# Patient Record
Sex: Female | Born: 1960
Health system: Southern US, Community
[De-identification: ages and names within clinical notes are randomized; demographics above are authoritative.]

## PROBLEM LIST (undated history)

## (undated) DIAGNOSIS — Z86718 Personal history of other venous thrombosis and embolism: Secondary | ICD-10-CM

## (undated) DIAGNOSIS — B009 Herpesviral infection, unspecified: Secondary | ICD-10-CM

## (undated) DIAGNOSIS — E079 Disorder of thyroid, unspecified: Secondary | ICD-10-CM

## (undated) DIAGNOSIS — M1990 Unspecified osteoarthritis, unspecified site: Secondary | ICD-10-CM

## (undated) DIAGNOSIS — F32A Depression, unspecified: Secondary | ICD-10-CM

## (undated) DIAGNOSIS — Z8489 Family history of other specified conditions: Secondary | ICD-10-CM

## (undated) DIAGNOSIS — I10 Essential (primary) hypertension: Secondary | ICD-10-CM

## (undated) DIAGNOSIS — F329 Major depressive disorder, single episode, unspecified: Secondary | ICD-10-CM

## (undated) DIAGNOSIS — T7840XA Allergy, unspecified, initial encounter: Secondary | ICD-10-CM

## (undated) DIAGNOSIS — R519 Headache, unspecified: Secondary | ICD-10-CM

## (undated) HISTORY — PX: WISDOM TOOTH EXTRACTION: SHX21

## (undated) HISTORY — PX: ABDOMINAL HYSTERECTOMY: SHX81

## (undated) HISTORY — DX: Major depressive disorder, single episode, unspecified: F32.9

## (undated) HISTORY — DX: Disorder of thyroid, unspecified: E07.9

## (undated) HISTORY — DX: Allergy, unspecified, initial encounter: T78.40XA

## (undated) HISTORY — DX: Depression, unspecified: F32.A

## (undated) HISTORY — PX: RETINAL DETACHMENT SURGERY: SHX105

## (undated) HISTORY — PX: THYROIDECTOMY: SHX17

## (undated) HISTORY — DX: Personal history of other venous thrombosis and embolism: Z86.718

## (undated) HISTORY — DX: Herpesviral infection, unspecified: B00.9

---

## 1998-08-06 DIAGNOSIS — Z86718 Personal history of other venous thrombosis and embolism: Secondary | ICD-10-CM

## 1998-08-06 HISTORY — DX: Personal history of other venous thrombosis and embolism: Z86.718

## 1998-11-16 ENCOUNTER — Inpatient Hospital Stay (HOSPITAL_COMMUNITY): Admission: AD | Admit: 1998-11-16 | Discharge: 1998-11-17 | Payer: Self-pay | Admitting: Emergency Medicine

## 1998-12-07 ENCOUNTER — Ambulatory Visit (HOSPITAL_COMMUNITY): Admission: RE | Admit: 1998-12-07 | Discharge: 1998-12-07 | Payer: Self-pay | Admitting: Emergency Medicine

## 2000-01-10 ENCOUNTER — Encounter: Admission: RE | Admit: 2000-01-10 | Discharge: 2000-01-10 | Payer: Self-pay | Admitting: Emergency Medicine

## 2000-01-10 ENCOUNTER — Encounter: Payer: Self-pay | Admitting: Emergency Medicine

## 2000-01-25 ENCOUNTER — Other Ambulatory Visit: Admission: RE | Admit: 2000-01-25 | Discharge: 2000-01-25 | Payer: Self-pay | Admitting: Obstetrics and Gynecology

## 2000-02-02 ENCOUNTER — Ambulatory Visit (HOSPITAL_COMMUNITY): Admission: RE | Admit: 2000-02-02 | Discharge: 2000-02-02 | Payer: Self-pay | Admitting: Obstetrics and Gynecology

## 2000-02-02 ENCOUNTER — Encounter: Payer: Self-pay | Admitting: Obstetrics and Gynecology

## 2000-03-29 ENCOUNTER — Inpatient Hospital Stay (HOSPITAL_COMMUNITY): Admission: AD | Admit: 2000-03-29 | Discharge: 2000-04-01 | Payer: Self-pay | Admitting: Obstetrics and Gynecology

## 2000-03-29 ENCOUNTER — Encounter (INDEPENDENT_AMBULATORY_CARE_PROVIDER_SITE_OTHER): Payer: Self-pay

## 2002-12-07 ENCOUNTER — Encounter (INDEPENDENT_AMBULATORY_CARE_PROVIDER_SITE_OTHER): Payer: Self-pay | Admitting: Specialist

## 2002-12-07 ENCOUNTER — Observation Stay (HOSPITAL_COMMUNITY): Admission: RE | Admit: 2002-12-07 | Discharge: 2002-12-08 | Payer: Self-pay | Admitting: Surgery

## 2002-12-07 ENCOUNTER — Encounter: Payer: Self-pay | Admitting: Surgery

## 2003-07-15 ENCOUNTER — Encounter: Admission: RE | Admit: 2003-07-15 | Discharge: 2003-07-15 | Payer: Self-pay | Admitting: Emergency Medicine

## 2004-07-05 ENCOUNTER — Encounter: Admission: RE | Admit: 2004-07-05 | Discharge: 2004-07-05 | Payer: Self-pay | Admitting: Emergency Medicine

## 2004-09-22 ENCOUNTER — Ambulatory Visit (HOSPITAL_COMMUNITY): Admission: RE | Admit: 2004-09-22 | Discharge: 2004-09-23 | Payer: Self-pay | Admitting: Ophthalmology

## 2005-08-03 ENCOUNTER — Encounter: Admission: RE | Admit: 2005-08-03 | Discharge: 2005-08-03 | Payer: Self-pay | Admitting: Emergency Medicine

## 2006-06-05 ENCOUNTER — Encounter (INDEPENDENT_AMBULATORY_CARE_PROVIDER_SITE_OTHER): Payer: Self-pay | Admitting: Specialist

## 2006-06-05 ENCOUNTER — Ambulatory Visit (HOSPITAL_COMMUNITY): Admission: RE | Admit: 2006-06-05 | Discharge: 2006-06-06 | Payer: Self-pay | Admitting: Obstetrics and Gynecology

## 2006-06-12 ENCOUNTER — Observation Stay (HOSPITAL_COMMUNITY): Admission: AD | Admit: 2006-06-12 | Discharge: 2006-06-13 | Payer: Self-pay | Admitting: Obstetrics and Gynecology

## 2008-01-15 ENCOUNTER — Encounter: Admission: RE | Admit: 2008-01-15 | Discharge: 2008-01-15 | Payer: Self-pay | Admitting: Emergency Medicine

## 2010-11-08 ENCOUNTER — Emergency Department (HOSPITAL_COMMUNITY)

## 2010-11-08 ENCOUNTER — Emergency Department (HOSPITAL_COMMUNITY)
Admission: EM | Admit: 2010-11-08 | Discharge: 2010-11-08 | Disposition: A | Discharge status: Home / Self Care | Attending: Emergency Medicine | Admitting: Emergency Medicine

## 2010-11-08 DIAGNOSIS — W010XXA Fall on same level from slipping, tripping and stumbling without subsequent striking against object, initial encounter: Secondary | ICD-10-CM | POA: Insufficient documentation

## 2010-11-08 DIAGNOSIS — M542 Cervicalgia: Secondary | ICD-10-CM | POA: Insufficient documentation

## 2010-11-08 DIAGNOSIS — Y990 Civilian activity done for income or pay: Secondary | ICD-10-CM | POA: Insufficient documentation

## 2010-11-08 DIAGNOSIS — R55 Syncope and collapse: Secondary | ICD-10-CM | POA: Insufficient documentation

## 2010-11-08 DIAGNOSIS — E039 Hypothyroidism, unspecified: Secondary | ICD-10-CM | POA: Insufficient documentation

## 2010-11-08 DIAGNOSIS — I10 Essential (primary) hypertension: Secondary | ICD-10-CM | POA: Insufficient documentation

## 2010-11-08 DIAGNOSIS — Y9269 Other specified industrial and construction area as the place of occurrence of the external cause: Secondary | ICD-10-CM | POA: Insufficient documentation

## 2010-11-08 DIAGNOSIS — R42 Dizziness and giddiness: Secondary | ICD-10-CM | POA: Insufficient documentation

## 2010-11-08 DIAGNOSIS — R209 Unspecified disturbances of skin sensation: Secondary | ICD-10-CM | POA: Insufficient documentation

## 2010-11-08 DIAGNOSIS — R61 Generalized hyperhidrosis: Secondary | ICD-10-CM | POA: Insufficient documentation

## 2010-11-08 DIAGNOSIS — IMO0002 Reserved for concepts with insufficient information to code with codable children: Secondary | ICD-10-CM | POA: Insufficient documentation

## 2010-11-08 DIAGNOSIS — S025XXA Fracture of tooth (traumatic), initial encounter for closed fracture: Secondary | ICD-10-CM | POA: Insufficient documentation

## 2010-12-22 NOTE — Discharge Summary (Signed)
[facility]  Patient:    Courtney [name], Courtney [name]                  MRN: [id number] Adm. Date:  [id number] Disc. Date: [id number] Attending:  Leonard Schwartz Dictator:   Miguel Dibble, C.N.M.                           Discharge Summary  DATE OF BIRTH:                Jul 07, 1961  ADMISSION DIAGNOSES:          1. Intrauterine pregnancy at term.                               2. Breech position.                               3. Fibroids.                               4. Plans for permanent sterilization.                               5. Infant will be placed for adoption.  DISCHARGE DIAGNOSES:          1. Intrauterine pregnancy at term.                               2. Breech position.                               3. Fibroids.                               4. Plans for permanent sterilization.                               5. Infant will be placed for adoption.                               6. Delivered viable baby boy weighing 7 pounds                                  5 ounces, Apgars of 9 and 9, footling                                  breech, 2 cm fibroid on posterior left                                  uterus.  PROCEDURES:                   1. Primary lower segment transverse cesarean  section.                               2. Bilateral tubal ligation.  ANESTHESIA:                   General.  HOSPITAL COURSE:              On March 29, 2000, 50 year old Rhys Anchondo was admitted for primary cesarean section and bilateral tubal ligation.  Her surgery was uncomplicated.  She delivered a viable baby boy. The infant weighed 7 pounds 5 ounces, Apgars of 9 and 9, by footling breech. Her cords and tubes were ligated, and she proceeded on to recovery in stable condition.  On postoperative day #1, March 30, 2000, her highest temperature was 100.6.  She was otherwise feeling satisfactory.  She saw her baby and  the adoptive mother the previous night and was sad but coping well.  She has made plans for the infant to be placed for adoption.  She had a small amount of old sanguineus drainage on her dressing but, otherwise, her incision was clean, dry, and intact.  Her hemoglobin had dropped from 13.1 to 11.7, hematocrit 34.1, white count 12, and platelets 149.  Vital signs remained stable for the remainder of the day.  On postoperative day #2, on March 31, 2000, she was visited by the Child psychotherapist.  The plan was for the patient to be discharged with her baby and then to have the adoption take place following the home study for the adoptive mother, Ms. Delores Williams.  The attorney in [place], [place], is working on this case.  The mother is otherwise feeling comfortable with this adoption plan.  On postoperative day #2, her vital signs were stable.  Incision clean, dry, and intact.  She was holding and caring for the infant, along with the adoptive mother.  On postoperative day #3, on April 01, 2000, she was still tearful about the adoption but resigned to her decision.  Her vital signs were stable.  Incision clean, dry, and intact.  Fundus firm below the umbilicus.  Scant lochia.  Trace edema in the extremities.  She was coping well.  After deeming to have received the full benefit of her hospitalization, she was discharged home after discontinuation of staples and application of Steri-Strips.  DISCHARGE INSTRUCTIONS:       Per [employer].  DISCHARGE MEDICATIONS:        Motrin, Tylox, prenatal vitamins. DD:  04/01/00 TD:  04/02/00 Job: [id number]

## 2010-12-22 NOTE — Op Note (Signed)
[facility]  Patient:    Courtney [name], Courtney [name]               MRN: [id number] Proc. Date: 03/29/00 Adm. Date:  [id number] Attending:  Leonard Schwartz                           Operative Report  PREOPERATIVE DIAGNOSES:       1. Term intrauterine pregnancy.                               2. Transverse presentation.                               3. Desires sterilization.                               4. Infant for adoption.  POSTOPERATIVE DIAGNOSES:      1. Term intrauterine pregnancy.                               2. Footling breech presentation.                               3. Desires sterilization.                               4. Fibroid uterus.                               5. Infant for adoption.  OPERATIONS:                   1. Primary low-transverse cesarean section.                               2. Bilateral tubal ligation.  SURGEON:                      Janine Limbo, M.D.  ASSISTANT:                    Miguel Dibble, C.N.M.  ANESTHESIA:                   General.  FINDINGS:                     A 7 pound 5 ounce female infant was delivered from a double-footling breech presentation.  The Apgars were 9 at one minute and 9 at five minutes. There was a 2 cm fibroid present on the left posterior uterus.  There was a 1 cm flat fibroid present on the left anterior uterus. The fallopian tubes and ovaries were normal.  DISPOSITION:                  The patient is a 50 year old female, gravida 2, para 0, 0, 1, 0, who presents at [redacted] weeks gestation.  She understands that an option for care is to attempt an external version, but she declines this option.  She wants  to proceed with cesarean delivery.  She also desires permanent sterilization.  This infant is for adoption.  The patient understands the indications for her procedure and she accepts the risks of, but not limited to anesthetic complications, bleeding, infection,  possible damage to surrounding organs, and possible tubal failure (10/1,000).  DESCRIPTION OF PROCEDURE:     The patient was brought to the operating room where the abdomen and perineum were prepped with multiple layers of Betadine. A Foley catheter was placed in the bladder.  The patient was sterilely draped. The patient was then given a general anesthetic.  A low-transverse incision was made in the abdomen and carried sharply through the subcutaneous tissue, the fascia and the anterior peritoneum.  An incision was made in the lower uterine segment and extended transversely.  The infant was delivered from a footling breech presentation without difficult.  The mouth and nose were suctioned.  The infant was handed to the awaiting pediatric team.  Routine cord blood studies were obtained.  The placenta was removed.  The uterine cavity was cleaned of amniotic fluid and clotted blood.  The cervix was dilated.  The incision was closed using a running locking suture of 2-0 Vicryl.  Hemostasis was adequate.  The left fallopian tube was identified and followed to its fimbriated end.  A knuckle of tube was made on the left using a tied suture ligature and then a free-tie of 0-plain catgut.  The knuckle of tube thus made was excised.  The cut ends of the tube were cauterized.  Hemostasis was adequate.  An identical procedure was carried out on the opposite side.  Again, hemostasis was adequate.  The peritoneal cavity was then irrigated.  The anterior peritoneum and the abdominal musculature were reapproximated in the midline.  The fascia was then closed using a running suture of 0-Vicryl followed by three interrupted sutures of 0-Vicryl.  The subcutaneous area was closed using a running suture of 2-0 Vicryl.  The skin was reapproximated using skin staples.  Sponge, needle and instrument counts were correct on two occasions.  The estimated blood loss for this procedure was 600 cc.  The  patient tolerated her procedure well.  She was taken to the recovery room in stable condition.  The infant was taken to the full-term nursery in stable condition. DD:  03/29/00 TD:  03/31/00 Job: [id number]

## 2010-12-22 NOTE — Op Note (Signed)
Courtney [name], Courtney [name]                     ACCOUNT NO.:  [id number]   MEDICAL RECORD NO.:  [id number]                   PATIENT TYPE:  AMB   LOCATION:  DAY                                  FACILITY:  [facility]   PHYSICIAN:  Velora Heckler, M.D.                DATE OF BIRTH:  [birth year]   DATE OF PROCEDURE:  12/07/2002  DATE OF DISCHARGE:                                 OPERATIVE REPORT   PREOPERATIVE DIAGNOSIS:  Thyroid goiter with compressive symptoms.   POSTOPERATIVE DIAGNOSIS:  Thyroid goiter with compressive symptoms.   PROCEDURE:  Total thyroidectomy.   SURGEON:  Velora Heckler, M.D.   ASSISTANT:  Gita Kudo, M.D.   ANESTHESIA:  General.   ESTIMATED BLOOD LOSS:  Minimal.   PREPARATION:  Betadine.   COMPLICATIONS:  None.   INDICATIONS:  The patient is a 50 year old black female, who presented in  January 2004, with large thyroid goiter.  This had gradually increased in  size over 3-4 years.  The patient noted tightening of her uniforms at work.  The patient developed mild compressive symptoms by March 2004.  She  developed a chronic cough.  She has developed intermittent hoarseness.  She  now comes to surgery for resection.   DESCRIPTION OF PROCEDURE:  The procedure is done in OR #11 at the [facility].  The patient is brought to the operating room and  placed in a supine position on the operating room table.  Following the  administration of general anesthesia, the patient is prepped and draped in  the usual strict aseptic fashion.  After ascertaining that an adequate level  of anesthesia had been obtained, a Kocher incision is made with a #15 blade.  Dissection is carried down through the subcutaneous tissues and platysma.  Hemostasis is obtained with the electrocautery.  Skin flaps are developed  cephalad and caudad from the thyroid notch to the sternal notch.  A Mahorner  self-retaining retractor is placed for exposure.  Strap muscles  are incised  in the midline.  Dissection is begun on the left side.  Strap muscles are  reflected laterally.  Left is markedly enlarged.  It appears to be a large  homogeneous goiter.  Venous tributaries are divided between medium  Ligaclips.  Strap muscles are reflected laterally.  Gland is rolled  medially.  Adventitial tissue is swept off using a Barista.  Superior pole is dissected out.  The superior pole vessels are ligated in  continuity with 2-0 silk ligatures and medium Ligaclips and divided.  Gland  is rolled further anteriorly.  Parathyroid tissue is identified and  preserved.  Recurrent laryngeal nerve is identified and preserved.  Branches  of the inferior thyroid artery are divided between small Ligaclips.  Venous  tributaries are divided between small and medium Ligaclips.  Gland is rolled  further up and onto the anterior surface of the trachea, where it  is  mobilized withe electrocautery.  A pack is placed in the left neck.   Next, we turned our attention to the right lobe.  Again, strap muscles are  reflected laterally.  Right lobe is much smaller, approximately 1-2 times  normal size.  There are no significant nodular densities.  There is no  lymphadenopathy.  Right lobe is mobilized.  Venous tributaries are again  divided between small Ligaclips.  Superior pole goes quite high in the neck.  It is dissected out, ligated in continuity with 2-0 silk ties and medium  Ligaclips, and divided.  Gland is rolled further anteriorly.  Both  parathyroid glands are identified and preserved.  onto the anterior trachea  and mobilized across the isthmus.  Good hemostasis is noted.  A dry pack is  placed in the right neck.   Next, turned our attention to the left lobe.  Again, strap muscles are  reflected laterally.  Left lobe is markedly smaller, although it does  contain at least two nodules on palpation.  Middle thyroid vein is divided  between small Ligaclips.  Inferior  venous tributaries are divided between  small Ligaclips.  Gland is rolled anteriorly.  Superior pole is mobilized  and ligated in continuity with 2-0 silk ties and medium Ligaclips and  divided.  Gland is rolled further anteriorly.  Recurrent laryngeal nerve is  identified and preserved.  Ligament of Allyson Sabal is transected, and the gland is  rolled up and onto the anterior surface of the trachea from which it is  resected with the electrocautery.  Gland is marked with a suture in the left  superior pole and submitted in toto to pathology for review.  Good  hemostasis is obtained in the neck after irrigation.  Hemostasis was  obtained with small Ligaclips and the electrocautery.  Surgicel is placed  over the area of the recurrent laryngeal nerves bilaterally.  Strap muscles  are reapproximated in the midline with interrupted 3-0 Vicryl sutures.  Platysma is closed with interrupted 3-0 Vicryl sutures.  Skin edges are  reapproximated with widely-spaced stainless steel staples and interspaced  half-inch Steri-Strips and Benzoin.  Sterile dressings are applied.  The  patient is awakened from anesthesia and brought to the recovery room in  stable condition.  The patient tolerated the procedure well.                                               Velora Heckler, M.D.    TMG/MEDQ  D:  12/07/2002  T:  12/07/2002  Job:  [id number]   cc:   Gabriel Earing, M.D.  [address]  [place]  [place] [address]  Fax: [phone]

## 2010-12-22 NOTE — Op Note (Signed)
Courtney [name], Courtney [name]           ACCOUNT NO.:  [id number]   MEDICAL RECORD NO.:  [id number]          PATIENT TYPE:  AMB   LOCATION:  SDC                           FACILITY:  [facility]   PHYSICIAN:  Janine Limbo, M.D.DATE OF BIRTH:  October 17, 1960   DATE OF PROCEDURE:  06/05/2006  DATE OF DISCHARGE:                                 OPERATIVE REPORT   PREOPERATIVE DIAGNOSIS:  1. Menorrhagia  2. Fibroid uterus.  3. Dysmenorrhea.  4. History of deep venous thrombosis.   POSTOPERATIVE DIAGNOSIS:  1. Menorrhagia  2. Fibroid uterus.  3. Dysmenorrhea.  4. History of deep venous thrombosis.   PROCEDURE:  1. Vaginal hysterectomy.  2. Bilateral salpingectomies.   SURGEON:  Janine Limbo, M.D.   FIRST ASSISTANT:  Hal Morales, M.D.   ANESTHETIC:  General.   DISPOSITION:  S. Decuir is a 50 year old female, para 1-0-0-1 who presents  with the above-mentioned diagnosis.  She understands the indications for her  surgical procedure; and she accepts the risks of, but not limited to,  anesthetic complications, bleeding, infections, and possible damage to the  surrounding organs.   FINDINGS:  The uterus was approximately 141 grams; and it contained multiple  fibroids.  It was approximately 10 weeks size.  The fallopian tubes and  ovaries appeared normal.  No other pathology was noted.   DESCRIPTION OF PROCEDURE:  The patient was taken to the operating room where  a general anesthetic was given.  The patient's abdomen, perineum, and vagina  were prepped with multiple layers of Betadine.  A Foley catheter was placed  in the bladder.  Examination under anesthesia was performed.  The patient  was sterilely draped.  The cervix was injected with 30 mL of a diluted  solution of Pitressin and saline.   A circumferential incision was made around the cervix; and the vaginal  mucosa was advanced anteriorly and posteriorly.  The anterior cul-de-sac and  the posterior cul-de-sac was  sharply entered.  Alternating from right-to-  left the uterosacral ligaments, paracervical tissues, parametrial tissues,  and uterine arteries were clamped, cut, sutured, and then tied securely.  The upper pedicles were clamped and cut; and the uterus was removed from the  operative field.  The upper pedicles were secured using first a free tie and  then suture ligatures.  Hemostasis was achieved using figure-of-eight  sutures on the posterior cuff.   Hemostasis was felt to be adequate.  The fallopian tubes were easily  accessible and, therefore, the fallopian tubes were clamped and removed.  Suture ligatures were used for hemostasis.  The sutures attached to the  uterosacral ligaments were brought out through the vaginal angles and tied  securely.  A McCall culdoplasty suture was placed in the posterior cul-de-  sac incorporating the uterosacral ligaments bilaterally, and the posterior  peritoneum.  A final check was made for hemostasis; and, again, hemostasis  was confirmed.  The vaginal cuff was then closed using figure-of-eight  sutures incorporating the anterior vaginal mucosa, the anterior peritoneum,  the posterior peritoneum, and the posterior vaginal mucosa.   Again, hemostasis was adequate.  The [employer]  culdoplasty suture was tied  securely and the apex of vagina was noted to elevate into the mid pelvis.  Sponge, needle, and instrument counts were correct on two occasions.  The  estimated blood loss was 125 mL; #0 Vicryl was the suture material used  throughout the case.  The patient was awakened from her anesthetic; and she  was taken to the recovery room in stable condition.  The uterus and the  fallopian tubes were sent to pathology for evaluation.  The patient was  noted to drain 250 mL of clear yellow urine.  The patient received 1600 mL  of IV fluid.      Janine Limbo, M.D.  Electronically Signed     AVS/MEDQ  D:  06/05/2006  T:  06/05/2006  Job:  [id number]    cc:   Reuben Likes, M.D.  Fax: [phone]

## 2010-12-22 NOTE — Op Note (Signed)
NAMEMAKYRA, CORPREW           ACCOUNT NO.:  [id number]   MEDICAL RECORD NO.:  [id number]          PATIENT TYPE:  OIB   LOCATION:  2899                         FACILITY:  [facility]   PHYSICIAN:  Alford Highland. Rankin, M.D.   DATE OF BIRTH:  April 12, 1961   DATE OF PROCEDURE:  09/22/2004  DATE OF DISCHARGE:                                 OPERATIVE REPORT   PREOPERATIVE DIAGNOSIS:  Rhegmatogenous retinal detachment of left eye --  macula off, left eye.   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   PROCEDURE:  Scleral buckle using 240, 70 and 287 solid silicone explants,  left eye, with retinal cryopexy.   SURGEON:  Alford Highland. Rankin, M.D.   ANESTHESIA:  General endotracheal.   COMPLICATIONS:  There were no complications.   ESTIMATED BLOOD LOSS:  Approximately 1 mL.   SPECIMENS:  There were no specimens.   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  This is an attempt to reattach the retinal and to  allow for visual acuity improvement.  The patient and the family understand  the risks of anesthesia including the rate occurrence of death, but the  patient also understands the risks of progressive disease and complications  from its intended repair including, but not limited to, hemorrhage,  infection, scarring, need for further surgery, no change in vision, loss of  vision and progressive disease despite intervention.   DESCRIPTION OF PROCEDURE:  After appropriate signed consent was obtained,  the patient was taken to the operating room.  In the operating, appropriate  monitoring was followed by general endotracheal anesthesia.  The left  periocular region was sterilely prepped and draped in the usual ophthalmic  fashion.  A lid  speculum was applied.  A conjunctival peritomy was then  fashioned 360 degrees with relaxing incisions made inferotemporally and  superonasally.  The rectus muscle was isolated on 2-0 silk ties.  Indirect  ophthalmoscopy was then performed and found the retinal break confirmed at  12 o'clock as well as an other adjacent at the 1 o'clock position, and an  area of lattice degeneration in the inferotemporal quadrant extending from  the 3 to 4 o'clock position.  These were treated with cryopexy.  At this  time, a solid silicone explant was selected and chosen to cover the bed of  the detachment, extending from 11 o'clock, down to and around 7:30 position  in this left eye.  The 240 encircling band was then used.  This was joined  in the inferonasal quadrant with a Watzke sleeve.  At this time, appropriate  tension was applied and tied temporarily with 5-0 Mersilene mattress sutures  in each quadrant.  External drainage of subretinal fluid was carried out in  the inferotemporal quadrant under direct observation without difficulty.  The buckle was then tightened to the appropriate tension.  The retina was  monitored and no  reaccumulation of fluid occurred.  The retina was  reattached nicely.  Appropriate tension was applied and the buckle edges  were trimmed.  The buckle was irrigated with bug juice.  Conjunctiva and  then Tenon's were then brought forward and closed with interrupted 7-0  Vicryl sutures.  Subconjunctival injections of antibiotic and steroid were  applied.  A sterile patch and Fox shield were applied.  Intraoperative  pressure had been accessed and found to be adequate.  The patient tolerated  the procedure well without complication.      GAR/MEDQ  D:  09/22/2004  T:  09/22/2004  Job:  [id number]   cc:   Bayard Beaver. Nelle Don, M.D.  [address] [place]  [place] [address]  Fax: [phone]

## 2010-12-22 NOTE — H&P (Signed)
NAMEJAKIA, Courtney [name]           ACCOUNT NO.:  [id number]   MEDICAL RECORD NO.:  [id number]          PATIENT TYPE:  OBV   LOCATION:  9305                          FACILITY:  [facility]   PHYSICIAN:  Naima A. Dillard, M.D. DATE OF BIRTH:  1961-07-29   DATE OF ADMISSION:  06/12/2006  DATE OF DISCHARGE:                                HISTORY & PHYSICAL   Courtney [name] is a 50 year old, gravida 1, para 1-0-1-1, who presents status  post total vaginal hysterectomy on October 31 with severe constipation.  She  reports she has had no significant bowel movement since approximately the  day prior to her surgery.  She has been taking hydrocodone on a regular  basis. She has been able to eat and drink, but beginning early this morning,  she began to have very severe gas pains and was seen at the office by Dr.  Stefano Gaul. She was sent to maternity admissions unit for evaluation.   She did receive a soap suds enema here in maternity admissions with minimal  results at this time to this point and an abdominal x-ray which was  negative.  Dr. Stefano Gaul was consulted, and the patient was given options of  being admitted to the third floor for observation until she does gain from  relief from the constipation or to go home and use the MiraLax as she was  recommended from her office visit today.  The patient does elect to be  admitted to the third floor for observation at this time secondary to the  degree of pain she is having when the gas pain hits.   LABORATORY DATA:  CBC today showed a hemoglobin of 12.4, hematocrit 36.0,  white blood cell count 12.1, platelets 289.  Neutrophil count was 88.   HISTORY OF PRESENT ILLNESS:  The patient had a total vaginal hysterectomy  and bilateral salpingectomy on October 31 secondary to history of fibroids,  dysmenorrhea, and menorrhagia.  She tolerated procedure well and was  discharged home the following day.  Since that time, the patient has been  taking her pain  medication on a regular basis but has reported no  significant bowel movement since the day before her surgery.   PAST MEDICAL HISTORY:  The patient denies hypertension and diabetes. She  does have a past history of a DVT associated with oral contraceptives.  She  is currently not on any anticoagulant.  She also has a history of goiter,  and this was removed with the removal also of her thyroid.  She also has  history of a previous C section and one TAB.   PAST SURGICAL HISTORY:  As noted.   SOCIAL HISTORY:  The patient is single.  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  Her mother is taking care of her at this time.  The patient is a  nonsmoker.   FAMILY HISTORY:  The patient's father has heart disease.  Her mother and  father have hypertension and diabetes.   REVIEW OF SYSTEMS:  See History of Present Illness.  The patient denies any  nausea or vomiting.  She is able to  eat.  She has no anorexia.  She is  having significant abdominal pain in a colicky manner when the gas pain  hits.  Between those times, her abdomen is soft and nontender.   X-ray today of the abdomen shows no obstruction.   PHYSICAL EXAMINATION:  VITAL SIGNS: Temperature 99.8, pulse 93, respirations  18, blood pressure 102/68.  GENERAL APPEARANCE:  Well-developed black female in moderate distress at  times with colicky gas pain.  HEENT: Within normal limits.  LUNGS:  Breath sounds are clear.  HEART: Regular rate and rhythm without murmur.  BREASTS: Soft and nontender.  ABDOMEN:  Soft and nontender between gas pains.  There are positive bowel  sounds noted.  PELVIC:  Exam is deferred at this time.  It was performed by Dr. Stefano Gaul,  and normal findings were noted at the office consistent with a patient who  has had a total vaginal hysterectomy.  EXTREMITIES:  Grossly normal.  NEUROLOGIC:  Exam is grossly normal.   ASSESSMENT:  1. Status post total vaginal hysterectomy on October 31.  2. Severe postoperative  gas and constipation.  3. Minimal results from soap suds enema.   PLAN:  1. Admit to the third floor for patient comfort.  2. Will push p.o. fluids.  3. Will give simethicone 160 mg x1 now as well to be given at bedtime and      after meals.  4. MiraLax 17 g dose will be given now.  5. If the patient has no significant bowel movement results by 6 p.m.,      will repeat the soap suds enema.  6. If patient has good results from any of these measures this evening,      will anticipate discharge home.      Courtney [name], C.N.M.      Naima A. Normand Sloop, M.D.  Electronically Signed    VLL/MEDQ  D:  06/12/2006  T:  06/12/2006  Job:  [id number]

## 2010-12-22 NOTE — H&P (Signed)
[name], [name]           ACCOUNT NO.:  [id number]   MEDICAL RECORD NO.:  [id number]          PATIENT TYPE:  AMB   LOCATION:  SDC                           FACILITY:  [facility]   PHYSICIAN:  Janine Limbo, M.D.DATE OF BIRTH:  Jan 27, 1961   DATE OF ADMISSION:  06/05/2006  DATE OF DISCHARGE:                                HISTORY & PHYSICAL   HISTORY OF PRESENT ILLNESS:  Courtney [name] is a 50 year old female, para 1-0-  1-1, who presents for vaginal hysterectomy.  The patient has been followed  at the [facility].  The patient has a known history of fibroids that was  confirmed by ultrasound.  She complained of menorrhagia and severe  dysmenorrhea.  Endometrial biopsy was performed and it showed benign  elements.  The patient's most recent Pap smear was within normal limits.  Pain medicines and other outpatient therapies have not relieved her  discomfort.  The patient has had one cesarean section in the past and she  has also had a first trimester pregnancy termination.   PAST MEDICAL HISTORY:  The patient denies hypertension and diabetes.  The  patient does have a past history of deep venous thrombosis associated with  oral contraceptives.  She is currently not on anticoagulant.   ALLERGIES:  No known drug allergies.   SOCIAL HISTORY:  The patient is single and she works as a Transport planner.  She denies cigarette use, alcohol use, and recreational drug use.   REVIEW OF SYSTEMS:  See history of present illness.   FAMILY HISTORY:  The patient's father has heart disease.  Her mother and  father have hypertension and diabetes.   PHYSICAL EXAMINATION:  VITAL SIGNS:  Weight 193 pounds, height 5 feet 8 inches.  HEENT:  Within normal limits.  CHEST:  Clear.  HEART:  Regular rate and rhythm.  BREASTS:  Without masses.  ABDOMEN:  Nontender.  EXTREMITIES:  Grossly normal.  NEUROLOGICAL:  Grossly normal.  PELVIC EXAM:  External genitalia normal.  Vagina normal.  The  cervix is  nontender and no lesions are present.  The uterus is 10-12 weeks size and  irregular.  Adnexa no masses.  Rectovaginal exam confirms.   ASSESSMENT:  1. Fibroid uterus.  2. Menorrhagia.  3. Dysmenorrhea.  4. History of deep venous thrombosis associated with oral contraceptives.   PLAN:  The patient will undergo a vaginal hysterectomy.  She understands the  indications for her surgical procedure and she accepts the risks of, but not  limited to, anesthetic complications, bleeding, infection, and possible  damage to surrounding organs.      Janine Limbo, M.D.  Electronically Signed     AVS/MEDQ  D:  05/31/2006  T:  05/31/2006  Job:  [id number]   cc:   Reuben Likes, M.D.  Fax: [phone]

## 2010-12-22 NOTE — Discharge Summary (Signed)
Courtney [name], Courtney [name]           ACCOUNT NO.:  [id number]   MEDICAL RECORD NO.:  [id number]          PATIENT TYPE:  OBV   LOCATION:  9305                          FACILITY:  [facility]   PHYSICIAN:  Janine Limbo, M.D.DATE OF BIRTH:  1960/10/08   DATE OF ADMISSION:  06/12/2006  DATE OF DISCHARGE:  06/13/2006                                 DISCHARGE SUMMARY   DISCHARGE DIAGNOSIS:  1. Status post vaginal hysterectomy on June 05, 2006.  2. Severe constipation.  3. Abdominal pain.   PROCEDURES THIS ADMISSION:  None.   HISTORY OF PRESENT ILLNESS:  The patient is a 50 year old female who had an  uncomplicated vaginal hysterectomy on June 05, 2006.  Operative findings  included fibroids.  The patient's pathology report returned showing benign  fibroids.  The patient's postoperative course was uncomplicated initially.  Her hemoglobin at discharge was 10.7 and her hematocrit was 30.9%.  The  patient presented on June 12, 2006, to the office complaining of severe  gaseous distension and gas pain.  She complained of abdominal pain.  She  reports that she was unable to have a bowel movement for the past 4-5 days.  She had tried stool softeners and over-the-counter laxatives without  success.  Her pain medication at home was no longer keeping her comfortable.  She denied nausea and vomiting.  She was able to tolerate her diet.  She  reports having a low grade fever at home.   ADMISSION PHYSICAL EXAM:  The patient was afebrile and her vital signs were  stable.  Her general exam was within normal limits except for a patient that  appeared obviously uncomfortable.  The patient's abdomen was soft and her  bowel sounds were active.  There was no guarding or rebound.  Pelvic exam  showed external genitalia is normal.  The vagina was tender at the apex but  no masses were appreciated.  There was no evidence of purulence or  infection.  The cervix was absent, the uterus was absent, there  were no  adnexal masses.   HOSPITAL COURSE:  The patient was initially seen in the maternity admissions  area.  She was given a soapsuds enema and she had minimal results.  She was  given pain medicine and medication for gas pain.  Again, she had minimal  relief.  The decision was made to bring the patient into the hospital for  observation.  When she was seen on the floor she was given a second enema  and, at this point, she did have good results.  She was also given MiraLax  and was able to pass gas.  She was observed overnight and reported having  another bowel movement and passing gas without difficulty.  On the morning  of June 13, 2006, she again was tolerating a regular diet.  She was much  more comfortable and was able to ambulate without difficulty.  Her abdominal  pain was markedly decreased.  We felt that the patient was ready for  discharge.  The patient did have an abdominal x-ray performed and there was  no evidence of obstruction.  The  patient had a hemoglobin of 12.4 and her  hematocrit was 36%.  She had a white blood cell count of 12,100.   DISCHARGE MEDICATIONS:  The patient will continue MiraLax 1 packet (17  grams) twice each day for three days.  She will then use one pack a day  after that.  She will use ibuprofen, acetaminophen, or Naprosyn for pain  rather than her narcotics.  She will call for questions or concerns.  She is  scheduled to return to see Dr. Stefano Gaul in five weeks for follow-up  examination but she can return any time that she needs our services.      Janine Limbo, M.D.  Electronically Signed     AVS/MEDQ  D:  06/13/2006  T:  06/13/2006  Job:  [id number]   cc:   Reuben Likes, M.D.  Fax: [phone]

## 2013-06-15 ENCOUNTER — Other Ambulatory Visit: Payer: Self-pay | Admitting: Internal Medicine

## 2013-06-15 DIAGNOSIS — Z1231 Encounter for screening mammogram for malignant neoplasm of breast: Secondary | ICD-10-CM

## 2013-07-10 ENCOUNTER — Ambulatory Visit

## 2013-07-13 ENCOUNTER — Ambulatory Visit

## 2013-07-15 ENCOUNTER — Ambulatory Visit
Admission: RE | Admit: 2013-07-15 | Discharge: 2013-07-15 | Disposition: A | Discharge status: Home / Self Care | Payer: Federal, State, Local not specified - PPO | Source: Ambulatory Visit | Attending: Internal Medicine | Admitting: Internal Medicine

## 2013-07-15 DIAGNOSIS — Z1231 Encounter for screening mammogram for malignant neoplasm of breast: Secondary | ICD-10-CM

## 2014-06-28 ENCOUNTER — Other Ambulatory Visit: Payer: Self-pay

## 2014-06-28 ENCOUNTER — Encounter: Payer: Self-pay | Admitting: Internal Medicine

## 2014-06-28 DIAGNOSIS — Z1231 Encounter for screening mammogram for malignant neoplasm of breast: Secondary | ICD-10-CM

## 2014-07-23 ENCOUNTER — Ambulatory Visit
Admission: RE | Admit: 2014-07-23 | Discharge: 2014-07-23 | Disposition: A | Discharge status: Home / Self Care | Payer: Federal, State, Local not specified - PPO | Source: Ambulatory Visit

## 2014-07-23 DIAGNOSIS — Z1231 Encounter for screening mammogram for malignant neoplasm of breast: Secondary | ICD-10-CM

## 2014-09-03 ENCOUNTER — Encounter: Payer: Self-pay | Admitting: Internal Medicine

## 2015-03-30 ENCOUNTER — Emergency Department (HOSPITAL_COMMUNITY)
Admission: EM | Admit: 2015-03-30 | Discharge: 2015-03-30 | Disposition: A | Discharge status: Home / Self Care | Payer: Federal, State, Local not specified - PPO | Attending: Emergency Medicine | Admitting: Emergency Medicine

## 2015-03-30 ENCOUNTER — Encounter (HOSPITAL_COMMUNITY): Payer: Self-pay | Admitting: Emergency Medicine

## 2015-03-30 DIAGNOSIS — S161XXA Strain of muscle, fascia and tendon at neck level, initial encounter: Secondary | ICD-10-CM | POA: Insufficient documentation

## 2015-03-30 DIAGNOSIS — I10 Essential (primary) hypertension: Secondary | ICD-10-CM | POA: Insufficient documentation

## 2015-03-30 DIAGNOSIS — S39012A Strain of muscle, fascia and tendon of lower back, initial encounter: Secondary | ICD-10-CM

## 2015-03-30 DIAGNOSIS — Y9389 Activity, other specified: Secondary | ICD-10-CM | POA: Diagnosis not present

## 2015-03-30 DIAGNOSIS — Y92410 Unspecified street and highway as the place of occurrence of the external cause: Secondary | ICD-10-CM | POA: Insufficient documentation

## 2015-03-30 DIAGNOSIS — Y998 Other external cause status: Secondary | ICD-10-CM | POA: Diagnosis not present

## 2015-03-30 DIAGNOSIS — S199XXA Unspecified injury of neck, initial encounter: Secondary | ICD-10-CM | POA: Diagnosis present

## 2015-03-30 HISTORY — DX: Essential (primary) hypertension: I10

## 2015-03-30 MED ORDER — CYCLOBENZAPRINE HCL 10 MG PO TABS: 10.0000 mg | ORAL_TABLET | Freq: Two times a day (BID) | ORAL | Status: DC | PRN

## 2015-03-30 MED ORDER — IBUPROFEN 600 MG PO TABS: 600.0000 mg | ORAL_TABLET | Freq: Four times a day (QID) | ORAL | Status: DC | PRN

## 2015-03-30 NOTE — Discharge Instructions (Signed)
Cervical Sprain °A cervical sprain is an injury in the neck in which the strong, fibrous tissues (ligaments) that connect your neck bones stretch or tear. Cervical sprains can range from mild to severe. Severe cervical sprains can cause the neck vertebrae to be unstable. This can lead to damage of the spinal cord and can result in serious nervous system problems. The amount of time it takes for a cervical sprain to get better depends on the cause and extent of the injury. Most cervical sprains heal in 1 to 3 weeks. °CAUSES  °Severe cervical sprains may be caused by:  °· Contact sport injuries (such as from football, rugby, wrestling, hockey, auto racing, gymnastics, diving, martial arts, or boxing).   °· Motor vehicle collisions.   °· Whiplash injuries. This is an injury from a sudden forward and backward whipping movement of the head and neck.  °· Falls.   °Mild cervical sprains may be caused by:  °· Being in an awkward position, such as while cradling a telephone between your ear and shoulder.   °· Sitting in a chair that does not offer proper support.   °· Working at a poorly designed computer station.   °· Looking up or down for long periods of time.   °SYMPTOMS  °· Pain, soreness, stiffness, or a burning sensation in the front, back, or sides of the neck. This discomfort may develop immediately after the injury or slowly, 24 hours or more after the injury.   °· Pain or tenderness directly in the middle of the back of the neck.   °· Shoulder or upper back pain.   °· Limited ability to move the neck.   °· Headache.   °· Dizziness.   °· Weakness, numbness, or tingling in the hands or arms.   °· Muscle spasms.   °· Difficulty swallowing or chewing.   °· Tenderness and swelling of the neck.   °DIAGNOSIS  °Most of the time your health care provider can diagnose a cervical sprain by taking your history and doing a physical exam. Your health care provider will ask about previous neck injuries and any known neck  problems, such as arthritis in the neck. X-rays may be taken to find out if there are any other problems, such as with the bones of the neck. Other tests, such as a CT scan or MRI, may also be needed.  °TREATMENT  °Treatment depends on the severity of the cervical sprain. Mild sprains can be treated with rest, keeping the neck in place (immobilization), and pain medicines. Severe cervical sprains are immediately immobilized. Further treatment is done to help with pain, muscle spasms, and other symptoms and may include: °· Medicines, such as pain relievers, numbing medicines, or muscle relaxants.   °· Physical therapy. This may involve stretching exercises, strengthening exercises, and posture training. Exercises and improved posture can help stabilize the neck, strengthen muscles, and help stop symptoms from returning.   °HOME CARE INSTRUCTIONS  °· Put ice on the injured area.   °¨ Put ice in a plastic bag.   °¨ Place a towel between your skin and the bag.   °¨ Leave the ice on for 15-20 minutes, 3-4 times a day.   °· If your injury was severe, you may have been given a cervical collar to wear. A cervical collar is a two-piece collar designed to keep your neck from moving while it heals. °¨ Do not remove the collar unless instructed by your health care provider. °¨ If you have long hair, keep it outside of the collar. °¨ Ask your health care provider before making any adjustments to your collar. Minor   adjustments may be required over time to improve comfort and reduce pressure on your chin or on the back of your head. °¨ If you are allowed to remove the collar for cleaning or bathing, follow your health care provider's instructions on how to do so safely. °¨ Keep your collar clean by wiping it with mild soap and water and drying it completely. If the collar you have been given includes removable pads, remove them every 1-2 days and hand wash them with soap and water. Allow them to air dry. They should be completely  dry before you wear them in the collar. °¨ If you are allowed to remove the collar for cleaning and bathing, wash and dry the skin of your neck. Check your skin for irritation or sores. If you see any, tell your health care provider. °¨ Do not drive while wearing the collar.   °· Only take over-the-counter or prescription medicines for pain, discomfort, or fever as directed by your health care provider.   °· Keep all follow-up appointments as directed by your health care provider.   °· Keep all physical therapy appointments as directed by your health care provider.   °· Make any needed adjustments to your workstation to promote good posture.   °· Avoid positions and activities that make your symptoms worse.   °· Warm up and stretch before being active to help prevent problems.   °SEEK MEDICAL CARE IF:  °· Your pain is not controlled with medicine.   °· You are unable to decrease your pain medicine over time as planned.   °· Your activity level is not improving as expected.   °SEEK IMMEDIATE MEDICAL CARE IF:  °· You develop any bleeding. °· You develop stomach upset. °· You have signs of an allergic reaction to your medicine.   °· Your symptoms get worse.   °· You develop new, unexplained symptoms.   °· You have numbness, tingling, weakness, or paralysis in any part of your body.   °MAKE SURE YOU:  °· Understand these instructions. °· Will watch your condition. °· Will get help right away if you are not doing well or get worse. °Document Released: 05/20/2007 Document Revised: 07/28/2013 Document Reviewed: 01/28/2013 °Exit[employer]® Patient Information ©2015 [employer]. This information is not intended to replace advice given to you by your health care provider. Make sure you discuss any questions you have with your health care provider. ° °Motor Vehicle Collision °It is common to have multiple bruises and sore muscles after a motor vehicle collision (MVC). These tend to feel worse for the first 24 hours. You may have  the most stiffness and soreness over the first several hours. You may also feel worse when you wake up the first morning after your collision. After this point, you will usually begin to improve with each day. The speed of improvement often depends on the severity of the collision, the number of injuries, and the location and nature of these injuries. °HOME CARE INSTRUCTIONS °· Put ice on the injured area. °¨ Put ice in a plastic bag. °¨ Place a towel between your skin and the bag. °¨ Leave the ice on for 15-20 minutes, 3-4 times a day, or as directed by your health care provider. °· Drink enough fluids to keep your urine clear or pale yellow. Do not drink alcohol. °· Take a warm shower or bath once or twice a day. This will increase blood flow to sore muscles. °· You may return to activities as directed by your caregiver. Be careful when lifting, as this may aggravate neck or back   pain. °· Only take over-the-counter or prescription medicines for pain, discomfort, or fever as directed by your caregiver. Do not use aspirin. This may increase bruising and bleeding. °SEEK IMMEDIATE MEDICAL CARE IF: °· You have numbness, tingling, or weakness in the arms or legs. °· You develop severe headaches not relieved with medicine. °· You have severe neck pain, especially tenderness in the middle of the back of your neck. °· You have changes in bowel or bladder control. °· There is increasing pain in any area of the body. °· You have shortness of breath, light-headedness, dizziness, or fainting. °· You have chest pain. °· You feel sick to your stomach (nauseous), throw up (vomit), or sweat. °· You have increasing abdominal discomfort. °· There is blood in your urine, stool, or vomit. °· You have pain in your shoulder (shoulder strap areas). °· You feel your symptoms are getting worse. °MAKE SURE YOU: °· Understand these instructions. °· Will watch your condition. °· Will get help right away if you are not doing well or get  worse. °Document Released: 07/23/2005 Document Revised: 12/07/2013 Document Reviewed: 12/20/2010 °ExitCare® Patient Information ©2015 [employer]. This information is not intended to replace advice given to you by your health care provider. Make sure you discuss any questions you have with your health care provider. ° °
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
## 2015-08-25 ENCOUNTER — Encounter: Payer: Self-pay | Admitting: Gastroenterology

## 2015-10-12 ENCOUNTER — Ambulatory Visit (AMBULATORY_SURGERY_CENTER): Payer: Self-pay | Admitting: *Deleted

## 2015-10-12 VITALS — Ht 68.0 in | Wt 193.0 lb

## 2015-10-12 DIAGNOSIS — Z1211 Encounter for screening for malignant neoplasm of colon: Secondary | ICD-10-CM

## 2015-10-12 MED ORDER — NA SULFATE-K SULFATE-MG SULF 17.5-3.13-1.6 GM/177ML PO SOLN: ORAL | Status: DC

## 2015-10-12 NOTE — Progress Notes (Signed)
Patient denies any allergies to eggs or soy. Patient was told by Doctor NOT to take in soy due to blood clot in leg, this was in year 2000. No blood clots since per pt. Patient states she avoids SOY.  Patient denies any problems with anesthesia/sedation. Patient denies any oxygen use at home and does not take any diet/weight loss medications. EMMI education assisgned to patient on colonoscopy, this was explained and instructions given to patient.

## 2015-10-14 ENCOUNTER — Telehealth: Payer: Self-pay | Admitting: Gastroenterology

## 2015-10-14 NOTE — Telephone Encounter (Signed)
Patient notified and she will call her PCP. She has no symptoms just does not think she can work with only clear liquid diet for procedure prep.

## 2015-10-14 NOTE — Telephone Encounter (Signed)
I have never met this patient before. Is she a patient of our clinic? Are her symptoms related to a GI illness? If she is a clinic patient with GI symptoms preventing her from working I can write her a note. If she is not a patient of our clinic or established with [place] and has non-GI symptoms this would need to come from her primary care. Thanks

## 2015-10-14 NOTE — Telephone Encounter (Signed)
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 She is asking for a note to miss work. Is this ok?

## 2015-10-28 ENCOUNTER — Encounter: Payer: Self-pay | Admitting: Gastroenterology

## 2015-10-28 ENCOUNTER — Ambulatory Visit (AMBULATORY_SURGERY_CENTER): Payer: Federal, State, Local not specified - PPO | Admitting: Gastroenterology

## 2015-10-28 VITALS — BP 136/92 | HR 59 | Temp 97.1°F | Resp 11 | Ht 68.0 in | Wt 193.0 lb

## 2015-10-28 DIAGNOSIS — Z1211 Encounter for screening for malignant neoplasm of colon: Secondary | ICD-10-CM

## 2015-10-28 MED ORDER — SODIUM CHLORIDE 0.9 % IV SOLN: 500.0000 mL | INTRAVENOUS | Status: DC

## 2015-10-28 NOTE — Patient Instructions (Signed)
YOU HAD AN ENDOSCOPIC PROCEDURE TODAY AT THE [facility]:   Refer to the procedure report that was given to you for any specific questions about what was found during the examination.  If the procedure report does not answer your questions, please call your gastroenterologist to clarify.  If you requested that your care partner not be given the details of your procedure findings, then the procedure report has been included in a sealed envelope for you to review at your convenience later.  YOU SHOULD EXPECT: Some feelings of bloating in the abdomen. Passage of more gas than usual.  Walking can help get rid of the air that was put into your GI tract during the procedure and reduce the bloating. If you had a lower endoscopy (such as a colonoscopy or flexible sigmoidoscopy) you may notice spotting of blood in your stool or on the toilet paper. If you underwent a bowel prep for your procedure, you may not have a normal bowel movement for a few days.  Please Note:  You might notice some irritation and congestion in your nose or some drainage.  This is from the oxygen used during your procedure.  There is no need for concern and it should clear up in a day or so.  SYMPTOMS TO REPORT IMMEDIATELY:   Following lower endoscopy (colonoscopy or flexible sigmoidoscopy):  Excessive amounts of blood in the stool  Significant tenderness or worsening of abdominal pains  Swelling of the abdomen that is new, acute  Fever of 100F or higher  For urgent or emergent issues, a gastroenterologist can be reached at any hour by calling [phone].   DIET: Your first meal following the procedure should be a small meal and then it is ok to progress to your normal diet. Heavy or fried foods are harder to digest and may make you feel nauseous or bloated.  Likewise, meals heavy in dairy and vegetables can increase bloating.  Drink plenty of fluids but you should avoid alcoholic beverages for 24  hours.  ACTIVITY:  You should plan to take it easy for the rest of today and you should NOT DRIVE or use heavy machinery until tomorrow (because of the sedation medicines used during the test).    FOLLOW UP: Our staff will call the number listed on your records the next business day following your procedure to check on you and address any questions or concerns that you may have regarding the information given to you following your procedure. If we do not reach you, we will leave a message.  However, if you are feeling well and you are not experiencing any problems, there is no need to return our call.  We will assume that you have returned to your regular daily activities without incident.  If any biopsies were taken you will be contacted by phone or by letter within the next 1-3 weeks.  Please call us at [phone] if you have not heard about the biopsies in 3 weeks.    SIGNATURES/CONFIDENTIALITY: You and/or your care partner have signed paperwork which will be entered into your electronic medical record.  These signatures attest to the fact that that the information above on your After Visit Summary has been reviewed and is understood.  Full responsibility of the confidentiality of this discharge information lies with you and/or your care-partner.  Diverticulosis and high fiber diet information given.  Next colonoscopy 10 years-2027.

## 2015-10-28 NOTE — Progress Notes (Signed)
A/ox3 pleased with MAC, report to Jane RN 

## 2015-10-28 NOTE — Op Note (Signed)
[facility] Patient Name: Courtney DonningMashalle [name] Procedure Date: 10/28/2015 10:42 AM MRN: [id number] Endoscopist: Viviann SpareSteven P. Adela LankArmbruster , MD Age: 55 Referring MD:  Date of Birth: 12/26/1960 Gender: Female Procedure:                Colonoscopy Indications:              Screening for colorectal malignant neoplasm Medicines:                Monitored Anesthesia Care Procedure:                Pre-Anesthesia Assessment:                           - Prior to the procedure, a History and Physical                            was performed, and patient medications and                            allergies were reviewed. The patient's tolerance of                            previous anesthesia was also reviewed. The risks                            and benefits of the procedure and the sedation                            options and risks were discussed with the patient.                            All questions were answered, and informed consent                            was obtained. Prior Anticoagulants: The patient has                            taken aspirin, last dose was 1 day prior to                            procedure. ASA Grade Assessment: II - A patient                            with mild systemic disease. After reviewing the                            risks and benefits, the patient was deemed in                            satisfactory condition to undergo the procedure.                           After obtaining informed consent, the colonoscope  was passed under direct vision. Throughout the                            procedure, the patient's blood pressure, pulse, and                            oxygen saturations were monitored continuously. The                            Model PCF-H190L [id number]) scope was introduced                            through the anus and advanced to the the cecum,                            identified by appendiceal  orifice and ileocecal                            valve. The colonoscopy was performed without                            difficulty. The patient tolerated the procedure                            well. The quality of the bowel preparation was                            adequate. The ileocecal valve, appendiceal orifice,                            and rectum were photographed. Scope In: 10:48:12 AM Scope Out: 11:02:52 AM Scope Withdrawal Time: 0 hours 11 minutes 57 seconds  Total Procedure Duration: 0 hours 14 minutes 40 seconds  Findings:      The perianal and digital rectal examinations were normal.      Scattered small-mouthed diverticula were found in the left colon and       right colon.      Anal papilla(e) were hypertrophied.      The exam was otherwise without abnormality on direct and retroflexion       views. Complications:            No immediate complications. Estimated blood loss:                            None. Estimated Blood Loss:     Estimated blood loss: none. Impression:               - Diverticulosis in the left colon and in the right                            colon.                           - Anal papilla(e) were hypertrophied.                           -  The examination was otherwise normal on direct                            and retroflexion views.                           - No specimens collected. Recommendation:           - Patient has a contact number available for                            emergencies. The signs and symptoms of potential                            delayed complications were discussed with the                            patient. Return to normal activities tomorrow.                            Written discharge instructions were provided to the                            patient.                           - Resume previous diet.                           - Continue present medications.                           - Repeat colonoscopy in  10 years for screening                            purposes. Procedure Code(s):        --- Professional ---                           [phone], Colonoscopy, flexible; diagnostic, including                            collection of specimen(s) by brushing or washing,                            when performed (separate procedure) CPT copyright 2016 [employer]. All rights reserved. Viviann Spare P. Adela Lank, MD Willaim Rayas. Loyd Salvador, MD 10/28/2015 11:05:32 AM This report has been signed electronically. Number of Addenda: 0 Referring MD:      Jarome Matin

## 2015-10-31 ENCOUNTER — Telehealth: Payer: Self-pay

## 2015-10-31 NOTE — Telephone Encounter (Signed)
  Follow up Call-  Call back number 10/28/2015  Post procedure Call Back phone  # [phone]  Permission to leave phone message Yes     Patient questions:  Do you have a fever, pain , or abdominal swelling? No. Pain Score  0 *  Have you tolerated food without any problems? Yes.    Have you been able to return to your normal activities? Yes.    Do you have any questions about your discharge instructions: Diet   No. Medications  No. Follow up visit  No.  Do you have questions or concerns about your Care? No.  Actions: * If pain score is 4 or above: No action needed, pain <4.

## 2015-11-07 DIAGNOSIS — F4325 Adjustment disorder with mixed disturbance of emotions and conduct: Secondary | ICD-10-CM | POA: Diagnosis not present

## 2015-11-09 DIAGNOSIS — F329 Major depressive disorder, single episode, unspecified: Secondary | ICD-10-CM | POA: Diagnosis not present

## 2015-11-09 DIAGNOSIS — Z566 Other physical and mental strain related to work: Secondary | ICD-10-CM | POA: Diagnosis not present

## 2015-11-09 DIAGNOSIS — F419 Anxiety disorder, unspecified: Secondary | ICD-10-CM | POA: Diagnosis not present

## 2015-11-09 DIAGNOSIS — Z658 Other specified problems related to psychosocial circumstances: Secondary | ICD-10-CM | POA: Diagnosis not present

## 2015-11-23 DIAGNOSIS — F4325 Adjustment disorder with mixed disturbance of emotions and conduct: Secondary | ICD-10-CM | POA: Diagnosis not present

## 2015-11-29 DIAGNOSIS — F4325 Adjustment disorder with mixed disturbance of emotions and conduct: Secondary | ICD-10-CM | POA: Diagnosis not present

## 2015-12-09 DIAGNOSIS — F4325 Adjustment disorder with mixed disturbance of emotions and conduct: Secondary | ICD-10-CM | POA: Diagnosis not present

## 2015-12-21 DIAGNOSIS — F4325 Adjustment disorder with mixed disturbance of emotions and conduct: Secondary | ICD-10-CM | POA: Diagnosis not present

## 2016-01-20 DIAGNOSIS — F4325 Adjustment disorder with mixed disturbance of emotions and conduct: Secondary | ICD-10-CM | POA: Diagnosis not present

## 2016-03-02 DIAGNOSIS — F4325 Adjustment disorder with mixed disturbance of emotions and conduct: Secondary | ICD-10-CM | POA: Diagnosis not present

## 2016-03-20 DIAGNOSIS — F4325 Adjustment disorder with mixed disturbance of emotions and conduct: Secondary | ICD-10-CM | POA: Diagnosis not present

## 2016-04-23 DIAGNOSIS — F4325 Adjustment disorder with mixed disturbance of emotions and conduct: Secondary | ICD-10-CM | POA: Diagnosis not present

## 2016-06-05 DIAGNOSIS — F4325 Adjustment disorder with mixed disturbance of emotions and conduct: Secondary | ICD-10-CM | POA: Diagnosis not present

## 2016-06-29 DIAGNOSIS — E038 Other specified hypothyroidism: Secondary | ICD-10-CM | POA: Diagnosis not present

## 2016-06-29 DIAGNOSIS — Z0000 Encounter for general adult medical examination without abnormal findings: Secondary | ICD-10-CM | POA: Diagnosis not present

## 2016-07-03 DIAGNOSIS — F4325 Adjustment disorder with mixed disturbance of emotions and conduct: Secondary | ICD-10-CM | POA: Diagnosis not present

## 2016-07-03 DIAGNOSIS — K080 Exfoliation of teeth due to systemic causes: Secondary | ICD-10-CM | POA: Diagnosis not present

## 2016-07-04 DIAGNOSIS — H5213 Myopia, bilateral: Secondary | ICD-10-CM | POA: Diagnosis not present

## 2016-07-04 DIAGNOSIS — H338 Other retinal detachments: Secondary | ICD-10-CM | POA: Diagnosis not present

## 2016-07-06 DIAGNOSIS — Z0000 Encounter for general adult medical examination without abnormal findings: Secondary | ICD-10-CM | POA: Diagnosis not present

## 2016-07-06 DIAGNOSIS — F418 Other specified anxiety disorders: Secondary | ICD-10-CM | POA: Diagnosis not present

## 2016-07-06 DIAGNOSIS — F329 Major depressive disorder, single episode, unspecified: Secondary | ICD-10-CM | POA: Diagnosis not present

## 2016-07-06 DIAGNOSIS — E038 Other specified hypothyroidism: Secondary | ICD-10-CM | POA: Diagnosis not present

## 2016-07-06 DIAGNOSIS — I10 Essential (primary) hypertension: Secondary | ICD-10-CM | POA: Diagnosis not present

## 2016-07-11 ENCOUNTER — Other Ambulatory Visit: Payer: Self-pay | Admitting: Internal Medicine

## 2016-07-11 DIAGNOSIS — Z1231 Encounter for screening mammogram for malignant neoplasm of breast: Secondary | ICD-10-CM

## 2016-07-16 DIAGNOSIS — F4325 Adjustment disorder with mixed disturbance of emotions and conduct: Secondary | ICD-10-CM | POA: Diagnosis not present

## 2016-08-09 DIAGNOSIS — F4325 Adjustment disorder with mixed disturbance of emotions and conduct: Secondary | ICD-10-CM | POA: Diagnosis not present

## 2016-08-25 DIAGNOSIS — R1111 Vomiting without nausea: Secondary | ICD-10-CM | POA: Diagnosis not present

## 2016-08-25 DIAGNOSIS — K529 Noninfective gastroenteritis and colitis, unspecified: Secondary | ICD-10-CM | POA: Diagnosis not present

## 2016-08-25 DIAGNOSIS — R404 Transient alteration of awareness: Secondary | ICD-10-CM | POA: Diagnosis not present

## 2016-08-25 DIAGNOSIS — R531 Weakness: Secondary | ICD-10-CM | POA: Diagnosis not present

## 2016-09-05 DIAGNOSIS — F4325 Adjustment disorder with mixed disturbance of emotions and conduct: Secondary | ICD-10-CM | POA: Diagnosis not present

## 2016-10-08 DIAGNOSIS — F4325 Adjustment disorder with mixed disturbance of emotions and conduct: Secondary | ICD-10-CM | POA: Diagnosis not present

## 2016-10-25 DIAGNOSIS — F4325 Adjustment disorder with mixed disturbance of emotions and conduct: Secondary | ICD-10-CM | POA: Diagnosis not present

## 2016-11-15 DIAGNOSIS — H02054 Trichiasis without entropian left upper eyelid: Secondary | ICD-10-CM | POA: Diagnosis not present

## 2016-11-15 DIAGNOSIS — H10412 Chronic giant papillary conjunctivitis, left eye: Secondary | ICD-10-CM | POA: Diagnosis not present

## 2016-11-20 DIAGNOSIS — F4325 Adjustment disorder with mixed disturbance of emotions and conduct: Secondary | ICD-10-CM | POA: Diagnosis not present

## 2016-11-23 DIAGNOSIS — F4325 Adjustment disorder with mixed disturbance of emotions and conduct: Secondary | ICD-10-CM | POA: Diagnosis not present

## 2016-11-29 DIAGNOSIS — F4325 Adjustment disorder with mixed disturbance of emotions and conduct: Secondary | ICD-10-CM | POA: Diagnosis not present

## 2016-12-06 DIAGNOSIS — F4325 Adjustment disorder with mixed disturbance of emotions and conduct: Secondary | ICD-10-CM | POA: Diagnosis not present

## 2016-12-24 DIAGNOSIS — H10413 Chronic giant papillary conjunctivitis, bilateral: Secondary | ICD-10-CM | POA: Diagnosis not present

## 2016-12-24 DIAGNOSIS — F4310 Post-traumatic stress disorder, unspecified: Secondary | ICD-10-CM | POA: Diagnosis not present

## 2016-12-27 DIAGNOSIS — F4325 Adjustment disorder with mixed disturbance of emotions and conduct: Secondary | ICD-10-CM | POA: Diagnosis not present

## 2017-01-09 DIAGNOSIS — F4310 Post-traumatic stress disorder, unspecified: Secondary | ICD-10-CM | POA: Diagnosis not present

## 2017-01-15 DIAGNOSIS — I10 Essential (primary) hypertension: Secondary | ICD-10-CM | POA: Diagnosis not present

## 2017-01-15 DIAGNOSIS — Z1389 Encounter for screening for other disorder: Secondary | ICD-10-CM | POA: Diagnosis not present

## 2017-01-15 DIAGNOSIS — Z6831 Body mass index (BMI) 31.0-31.9, adult: Secondary | ICD-10-CM | POA: Diagnosis not present

## 2017-01-15 DIAGNOSIS — M25561 Pain in right knee: Secondary | ICD-10-CM | POA: Diagnosis not present

## 2017-01-25 DIAGNOSIS — F4310 Post-traumatic stress disorder, unspecified: Secondary | ICD-10-CM | POA: Diagnosis not present

## 2017-02-04 ENCOUNTER — Ambulatory Visit (INDEPENDENT_AMBULATORY_CARE_PROVIDER_SITE_OTHER): Payer: Federal, State, Local not specified - PPO | Admitting: Orthopaedic Surgery

## 2017-02-04 ENCOUNTER — Encounter (INDEPENDENT_AMBULATORY_CARE_PROVIDER_SITE_OTHER): Payer: Self-pay | Admitting: Orthopaedic Surgery

## 2017-02-04 ENCOUNTER — Ambulatory Visit (INDEPENDENT_AMBULATORY_CARE_PROVIDER_SITE_OTHER): Payer: Self-pay

## 2017-02-04 DIAGNOSIS — M25561 Pain in right knee: Secondary | ICD-10-CM | POA: Diagnosis not present

## 2017-02-04 DIAGNOSIS — M25562 Pain in left knee: Secondary | ICD-10-CM

## 2017-02-04 DIAGNOSIS — F4310 Post-traumatic stress disorder, unspecified: Secondary | ICD-10-CM | POA: Diagnosis not present

## 2017-02-04 MED ORDER — MELOXICAM 7.5 MG PO TABS: 7.5000 mg | ORAL_TABLET | Freq: Two times a day (BID) | ORAL | 2 refills | Status: DC | PRN

## 2017-02-04 NOTE — Progress Notes (Signed)
Office Visit Note   Patient: Courtney [name]           Date of Birth: 1961-03-31           MRN: [id number] Visit Date: 02/04/2017              Requested by: Jarome Matin, MD [address] [place], [place] [address] PCP: Jarome Matin, MD   Assessment & Plan: Visit Diagnoses:  1. Left knee pain, unspecified chronicity     Plan: Patient has osteoarthritis flareup. She is doing well overall. She declined a cortisone injection. Hinged knee brace was provided today. I think water aerobics as a excellent idea. Follow-up with me as needed.  Follow-Up Instructions: Return if symptoms worsen or fail to improve.   Orders:  Orders Placed This Encounter  Procedures  . XR KNEE 3 VIEW RIGHT   Meds ordered this encounter  Medications  . meloxicam (MOBIC) 7.5 MG tablet    Sig: Take 1 tablet (7.5 mg total) by mouth 2 (two) times daily as needed for pain.    Dispense:  30 tablet    Refill:  2      Procedures: No procedures performed   Clinical Data: No additional findings.   Subjective: Chief Complaint  Patient presents with  . Right Knee - Pain, Injury    States she tripped over tray table at nursing home.    Patient is a very pleasant 56 year old female with right knee pain for about 6 weeks. She had a non-contact injury and subsequent swelling of the right knee. Swelling is for the most part resolved. She comes in today for further evaluation and treatment. She denies any mechanical symptoms. Denies any instability. Pain does not radiate    Review of Systems  Constitutional: Negative.   HENT: Negative.   Eyes: Negative.   Respiratory: Negative.   Cardiovascular: Negative.   Endocrine: Negative.   Musculoskeletal: Negative.   Neurological: Negative.   Hematological: Negative.   Psychiatric/Behavioral: Negative.   All other systems reviewed and are negative.    Objective: Vital Signs: There were no vitals taken for this visit.  Physical Exam    Constitutional: She is oriented to person, place, and time. She appears well-developed and well-nourished.  HENT:  Head: Normocephalic and atraumatic.  Eyes: EOM are normal.  Neck: Neck supple.  Pulmonary/Chest: Effort normal.  Abdominal: Soft.  Neurological: She is alert and oriented to person, place, and time.  Skin: Skin is warm. Capillary refill takes less than 2 seconds.  Psychiatric: She has a normal mood and affect. Her behavior is normal. Judgment and thought content normal.  Nursing note and vitals reviewed.   Ortho Exam Right knee exam shows a trace effusion. She has normal range of motion. Collaterals and cruciates are stable. Mild valgus alignment. Specialty Comments:  No specialty comments available.  Imaging: Xr Knee 3 View Right  Result Date: 02/04/2017 Moderate degenerative joint disease    PMFS History: There are no active problems to display for this patient.  Past Medical History:  Diagnosis Date  . Allergy   . Depression   . Herpes   . Hx of blood clots 2000   left leg x1   . Hypertension   . Thyroid disease     Family History  Problem Relation Age of Onset  . Colon cancer Neg Hx   . Diabetes Mother   . Heart disease Mother   . Heart disease Father   . Kidney disease Father   .  Prostate cancer Paternal Uncle   . Diabetes Maternal Grandfather   . Stroke Maternal Grandfather   . Ovarian cancer Paternal Grandfather   . Kidney disease Paternal Grandfather     Past Surgical History:  Procedure Laterality Date  . ABDOMINAL HYSTERECTOMY    . CESAREAN SECTION    . RETINAL DETACHMENT SURGERY    . THYROIDECTOMY    . WISDOM TOOTH EXTRACTION     Social History   Occupational History  . Not on file.   Social History Main Topics  . Smoking status: Never Smoker  . Smokeless tobacco: Never Used  . Alcohol use No  . Drug use: No  . Sexual activity: Yes

## 2017-02-12 DIAGNOSIS — F4325 Adjustment disorder with mixed disturbance of emotions and conduct: Secondary | ICD-10-CM | POA: Diagnosis not present

## 2017-02-12 DIAGNOSIS — F4310 Post-traumatic stress disorder, unspecified: Secondary | ICD-10-CM | POA: Diagnosis not present

## 2017-02-20 DIAGNOSIS — F4310 Post-traumatic stress disorder, unspecified: Secondary | ICD-10-CM | POA: Diagnosis not present

## 2017-02-25 DIAGNOSIS — F4310 Post-traumatic stress disorder, unspecified: Secondary | ICD-10-CM | POA: Diagnosis not present

## 2017-03-05 DIAGNOSIS — F4310 Post-traumatic stress disorder, unspecified: Secondary | ICD-10-CM | POA: Diagnosis not present

## 2017-03-13 DIAGNOSIS — F4310 Post-traumatic stress disorder, unspecified: Secondary | ICD-10-CM | POA: Diagnosis not present

## 2017-03-18 ENCOUNTER — Ambulatory Visit (INDEPENDENT_AMBULATORY_CARE_PROVIDER_SITE_OTHER): Payer: Federal, State, Local not specified - PPO | Admitting: Surgery

## 2017-03-18 ENCOUNTER — Encounter (INDEPENDENT_AMBULATORY_CARE_PROVIDER_SITE_OTHER): Payer: Self-pay | Admitting: Surgery

## 2017-03-18 ENCOUNTER — Ambulatory Visit (INDEPENDENT_AMBULATORY_CARE_PROVIDER_SITE_OTHER): Payer: Federal, State, Local not specified - PPO | Admitting: Family

## 2017-03-18 VITALS — BP 109/79 | HR 92 | Ht 67.0 in | Wt 200.0 lb

## 2017-03-18 DIAGNOSIS — M25561 Pain in right knee: Secondary | ICD-10-CM | POA: Diagnosis not present

## 2017-03-18 MED ORDER — BUPIVACAINE HCL 0.25 % IJ SOLN
6.0000 mL | INTRAMUSCULAR | Status: AC | PRN
  Administered 2017-03-18: 6 mL via INTRA_ARTICULAR

## 2017-03-18 MED ORDER — METHYLPREDNISOLONE ACETATE 40 MG/ML IJ SUSP
80.0000 mg | INTRAMUSCULAR | Status: AC | PRN
  Administered 2017-03-18: 80 mg

## 2017-03-18 MED ORDER — LIDOCAINE HCL 1 % IJ SOLN
3.0000 mL | INTRAMUSCULAR | Status: AC | PRN
  Administered 2017-03-18: 3 mL

## 2017-03-18 NOTE — Progress Notes (Signed)
Office Visit Note   Patient: Courtney [name]           Date of Birth: Feb 26, 1961           MRN: [id number] Visit Date: 03/18/2017              Requested by: Jarome MatinPaterson, Daniel, MD [address] [place], [place] [id number] PCP: Jarome MatinPaterson, Daniel, MD   Assessment & Plan: Visit Diagnoses:  1. Acute pain of right knee   Symptomatically medial plica   Plan: To try to give patient some relief offered right knee injection. Patient sent right knee was prepped with Betadine intra-articular Marcaine/Depo-Medrol injection was performed. Tolerated procedure well without any complications. Patient works with the [employer] service and she will be out of work the rest of this week and return back on Monday. Follow-up in 3 weeks with Dr. Rhona Leavenshiu for recheck. If she continues to be symptomatic and does not have any relief from today's injection we will schedule MRI to rule out meniscus tear.  Follow-Up Instructions: Return in about 3 weeks (around 04/08/2017) for Dr Roda ShuttersXu .   Orders:  Orders Placed This Encounter  Procedures  . Large Joint Injection/Arthrocentesis   No orders of the defined types were placed in this encounter.     Procedures: Large Joint Inj Date/Time: 03/18/2017 1:57 PM Performed by: Naida SleightWENS, Kaydn Kumpf M Authorized by: Naida SleightWENS, Kennard Fildes M   Consent Given by:  Patient Timeout: prior to procedure the correct patient, procedure, and site was verified   Location:  Knee Site:  R knee Needle Size:  25 G Needle Length:  1.5 inches Approach:  Anterolateral Ultrasound Guidance: No   Fluoroscopic Guidance: No   Medications:  3 mL lidocaine 1 %; 6 mL bupivacaine 0.25 %; 80 mg methylPREDNISolone acetate 40 MG/ML Aspiration Attempted: No       Clinical Data: No additional findings.   Subjective: Chief Complaint  Patient presents with  . Right Knee - Pain, Weakness, Edema    HPI Patient returns with ongoing right knee pain and swelling. Symptoms ongoing for several weeks. Pain more  localized to the medial aspect of the knee. The last week she's also been having some low back pain due to abnormal gait. Also intermittent numbness and tingling in the left foot. Dr. Roda Shuttersxu had previous and recommended radius articular Marcaine/Depo-Medrol injection but patient declined. Review of Systems no current cardiopulmonary GI GU issues  Objective: Vital Signs: BP 109/79   Pulse 92   Ht 5\' 7"  (1.702 m)   Wt 200 lb (90.7 kg)   BMI 31.32 kg/m   Physical Exam  Constitutional: She is oriented to person, place, and time. She appears well-developed and well-nourished.  HENT:  Head: Normocephalic and atraumatic.  Eyes: Pupils are equal, round, and reactive to light. EOM are normal.  Neck: Normal range of motion.  Musculoskeletal:  Patient does have some knee swelling. Does not have a large effusion. She has a moderate to markedly tender swollen symptomatically medial plica. Medial joint line also tender. Negative McMurray's test. Ligaments are stable. Calf nontender. Negative straight leg raise. Negative logroll bilateral hips. Neurovascular intact.  Neurological: She is alert and oriented to person, place, and time.  Skin: Skin is warm and dry.  Psychiatric: She has a normal mood and affect.    Ortho Exam  Specialty Comments:  No specialty comments available.  Imaging: No results found.   PMFS History: There are no active problems to display for this patient.  Past Medical  History:  Diagnosis Date  . Allergy   . Depression   . Herpes   . Hx of blood clots 2000   left leg x1   . Hypertension   . Thyroid disease     Family History  Problem Relation Age of Onset  . Colon cancer Neg Hx   . Diabetes Mother   . Heart disease Mother   . Heart disease Father   . Kidney disease Father   . Prostate cancer Paternal Uncle   . Diabetes Maternal Grandfather   . Stroke Maternal Grandfather   . Ovarian cancer Paternal Grandfather   . Kidney disease Paternal Grandfather       Past Surgical History:  Procedure Laterality Date  . ABDOMINAL HYSTERECTOMY    . CESAREAN SECTION    . RETINAL DETACHMENT SURGERY    . THYROIDECTOMY    . WISDOM TOOTH EXTRACTION     Social History   Occupational History  . Not on file.   Social History Main Topics  . Smoking status: Never Smoker  . Smokeless tobacco: Never Used  . Alcohol use No  . Drug use: No  . Sexual activity: Yes

## 2017-03-19 DIAGNOSIS — R05 Cough: Secondary | ICD-10-CM | POA: Diagnosis not present

## 2017-03-19 DIAGNOSIS — J209 Acute bronchitis, unspecified: Secondary | ICD-10-CM | POA: Diagnosis not present

## 2017-03-19 DIAGNOSIS — M25561 Pain in right knee: Secondary | ICD-10-CM | POA: Diagnosis not present

## 2017-03-19 DIAGNOSIS — I10 Essential (primary) hypertension: Secondary | ICD-10-CM | POA: Diagnosis not present

## 2017-03-29 ENCOUNTER — Ambulatory Visit (INDEPENDENT_AMBULATORY_CARE_PROVIDER_SITE_OTHER): Payer: Federal, State, Local not specified - PPO | Admitting: Orthopaedic Surgery

## 2017-03-29 ENCOUNTER — Encounter (INDEPENDENT_AMBULATORY_CARE_PROVIDER_SITE_OTHER): Payer: Self-pay | Admitting: Orthopaedic Surgery

## 2017-03-29 DIAGNOSIS — F4310 Post-traumatic stress disorder, unspecified: Secondary | ICD-10-CM | POA: Diagnosis not present

## 2017-03-29 DIAGNOSIS — M25461 Effusion, right knee: Secondary | ICD-10-CM | POA: Diagnosis not present

## 2017-03-29 NOTE — Progress Notes (Signed)
Office Visit Note   Patient: Courtney [name]           Date of Birth: May 02, 1961           MRN: [id number] Visit Date: 03/29/2017              Requested by: Jarome Matin, MD [address] [place], [place] [address] PCP: Jarome Matin, MD   Assessment & Plan: Visit Diagnoses:  1. Effusion of right knee     Plan: Right knee was aspirated and injected with cortisone. We obtained about 37 mL of synovial fluid. Recommend MRI to fully evaluate the knee given failure of conservative treatment and recurrent effusion  Follow-Up Instructions: Return in about 10 days (around 04/08/2017).   Orders:  Orders Placed This Encounter  Procedures  . MR Knee Right w/o contrast  . Cell count + diff,  w/ cryst-synvl fld   No orders of the defined types were placed in this encounter.     Procedures: Large Joint Inj Date/Time: 03/29/2017 9:57 AM Performed by: Tarry Kos Authorized by: Tarry Kos   Consent Given by:  Patient Timeout: prior to procedure the correct patient, procedure, and site was verified   Indications:  Pain Location:  Knee Site:  R knee Prep: patient was prepped and draped in usual sterile fashion   Needle Size:  22 G Ultrasound Guidance: No   Fluoroscopic Guidance: No   Arthrogram: No   Aspiration Attempted: Yes   Aspirate amount (mL):  37 Aspirate:  Clear Patient tolerance:  Patient tolerated the procedure well with no immediate complications     Clinical Data: No additional findings.   Subjective: Chief Complaint  Patient presents with  . Right Knee - Pain, Follow-up    Patient comes in today with worsening right knee pain and swelling. She is having trouble ambulating.  Previous cortisone injection couple weeks ago did not help. Denies any numbness and tingling.    Review of Systems  Constitutional: Negative.   HENT: Negative.   Eyes: Negative.   Respiratory: Negative.   Cardiovascular: Negative.   Endocrine: Negative.     Musculoskeletal: Negative.   Neurological: Negative.   Hematological: Negative.   Psychiatric/Behavioral: Negative.   All other systems reviewed and are negative.    Objective: Vital Signs: There were no vitals taken for this visit.  Physical Exam  Constitutional: She is oriented to person, place, and time. She appears well-developed and well-nourished.  Pulmonary/Chest: Effort normal.  Neurological: She is alert and oriented to person, place, and time.  Skin: Skin is warm. Capillary refill takes less than 2 seconds.  Psychiatric: She has a normal mood and affect. Her behavior is normal. Judgment and thought content normal.  Nursing note and vitals reviewed.   Ortho Exam Right knee exam shows a moderate size joint effusion. Limited range of motion secondary to guarding. Specialty Comments:  No specialty comments available.  Imaging: No results found.   PMFS History: Patient Active Problem List   Diagnosis Date Noted  . Effusion of right knee 03/29/2017   Past Medical History:  Diagnosis Date  . Allergy   . Depression   . Herpes   . Hx of blood clots 2000   left leg x1   . Hypertension   . Thyroid disease     Family History  Problem Relation Age of Onset  . Colon cancer Neg Hx   . Diabetes Mother   . Heart disease Mother   . Heart  disease Father   . Kidney disease Father   . Prostate cancer Paternal Uncle   . Diabetes Maternal Grandfather   . Stroke Maternal Grandfather   . Ovarian cancer Paternal Grandfather   . Kidney disease Paternal Grandfather     Past Surgical History:  Procedure Laterality Date  . ABDOMINAL HYSTERECTOMY    . CESAREAN SECTION    . RETINAL DETACHMENT SURGERY    . THYROIDECTOMY    . WISDOM TOOTH EXTRACTION     Social History   Occupational History  . Not on file.   Social History Main Topics  . Smoking status: Never Smoker  . Smokeless tobacco: Never Used  . Alcohol use No  . Drug use: No  . Sexual activity: Yes

## 2017-03-30 LAB — SYNOVIAL CELL COUNT + DIFF, W/ CRYSTALS
Basophils, %: 0 %
Eosinophils-Synovial: 0 % (ref 0–2)
LYMPHOCYTES-SYNOVIAL FLD: 18 % (ref 0–74)
MONOCYTE/MACROPHAGE: 35 % (ref 0–69)
Neutrophil, Synovial: 47 % — ABNORMAL HIGH (ref 0–24)
Synoviocytes, %: 0 % (ref 0–15)
WBC, SYNOVIAL: 159 {cells}/uL — AB (ref <150)

## 2017-04-07 ENCOUNTER — Ambulatory Visit
Admission: RE | Admit: 2017-04-07 | Discharge: 2017-04-07 | Disposition: A | Discharge status: Home / Self Care | Payer: Federal, State, Local not specified - PPO | Source: Ambulatory Visit | Attending: Orthopaedic Surgery | Admitting: Orthopaedic Surgery

## 2017-04-07 DIAGNOSIS — M25461 Effusion, right knee: Secondary | ICD-10-CM

## 2017-04-10 ENCOUNTER — Ambulatory Visit
Admission: RE | Admit: 2017-04-10 | Discharge: 2017-04-10 | Disposition: A | Discharge status: Home / Self Care | Payer: Federal, State, Local not specified - PPO | Source: Ambulatory Visit | Attending: Orthopaedic Surgery | Admitting: Orthopaedic Surgery

## 2017-04-10 DIAGNOSIS — M25561 Pain in right knee: Secondary | ICD-10-CM | POA: Diagnosis not present

## 2017-04-11 ENCOUNTER — Encounter (INDEPENDENT_AMBULATORY_CARE_PROVIDER_SITE_OTHER): Payer: Self-pay | Admitting: Orthopaedic Surgery

## 2017-04-11 ENCOUNTER — Ambulatory Visit (INDEPENDENT_AMBULATORY_CARE_PROVIDER_SITE_OTHER): Payer: Federal, State, Local not specified - PPO | Admitting: Orthopaedic Surgery

## 2017-04-11 DIAGNOSIS — M1712 Unilateral primary osteoarthritis, left knee: Secondary | ICD-10-CM

## 2017-04-11 DIAGNOSIS — M25461 Effusion, right knee: Secondary | ICD-10-CM | POA: Diagnosis not present

## 2017-04-11 NOTE — Progress Notes (Signed)
Office Visit Note   Patient: Courtney [name]           Date of Birth: 1961/07/30           MRN: [id number] Visit Date: 04/11/2017              Requested by: Jarome MatinPaterson, Daniel, MD [address] [place], [place] [address] PCP: Jarome MatinPaterson, Daniel, MD   Assessment & Plan: Visit Diagnoses:  1. Effusion of right knee   2. Unilateral primary osteoarthritis, left knee     Plan: MRI shows moderate to severe tricompartmental  Degenerative and macerated lateral meniscus.Patient has had decline in quality of life and difficulty with ADLs.  We had a long discussion about arthroscopic debridement of the meniscus and chondromalacia versus a total knee replacement.  Questions encouraged and answered.she will let us know what she decides to do.   Follow-Up Instructions: Return if symptoms worsen or fail to improve.   Orders:  No orders of the defined types were placed in this encounter.  No orders of the defined types were placed in this encounter.     Procedures: No procedures performed   Clinical Data: No additional findings.   Subjective: Chief Complaint  Patient presents with  . Right Knee - Pain, Follow-up    Patient follows up today for her MRI of her left knee.  She denies any changes in history.    Review of Systems  Constitutional: Negative.   HENT: Negative.   Eyes: Negative.   Respiratory: Negative.   Cardiovascular: Negative.   Endocrine: Negative.   Musculoskeletal: Negative.   Neurological: Negative.   Hematological: Negative.   Psychiatric/Behavioral: Negative.   All other systems reviewed and are negative.    Objective: Vital Signs: There were no vitals taken for this visit.  Physical Exam  Constitutional: She is oriented to person, place, and time. She appears well-developed and well-nourished.  Pulmonary/Chest: Effort normal.  Neurological: She is alert and oriented to person, place, and time.  Skin: Skin is warm. Capillary refill takes less  than 2 seconds.  Psychiatric: She has a normal mood and affect. Her behavior is normal. Judgment and thought content normal.  Nursing note and vitals reviewed.   Ortho Exam Left knee exam is stable. Specialty Comments:  No specialty comments available.  Imaging: Mr Knee Right W/o Contrast  Result Date: 04/11/2017 CLINICAL DATA:  Right knee pain and swelling.  Medial pain. EXAM: MRI OF THE RIGHT KNEE WITHOUT CONTRAST TECHNIQUE: Multiplanar, multisequence MR imaging of the knee was performed. No intravenous contrast was administered. COMPARISON:  None. FINDINGS: MENISCI Medial meniscus:  Intact. Lateral meniscus: Maceration of the anterior horn of the lateral meniscus. LIGAMENTS Cruciates:  Intact ACL and PCL. Collaterals: Medial collateral ligament is intact. Lateral collateral ligament complex is intact. CARTILAGE Patellofemoral: Partial-thickness cartilage loss of the medial and lateral patellofemoral compartments with subchondral reactive marrow changes of the patellar apex. Partial-thickness cartilage loss of the lateral trochlea. Medial: Partial-thickness cartilage loss of the medial femorotibial compartment. Lateral: Full-thickness cartilage loss of the lateral femorotibial compartment with subchondral reactive marrow changes. Joint: Large joint effusion. Normal Hoffa's fat. No plical thickening. Popliteal Fossa: Small Baker cyst with synovitis. Intact popliteus tendon. Extensor Mechanism: Intact quadriceps tendon and patellar tendon. Intact medial and lateral patellar retinaculum. Intact MPFL. Bones:  No marrow signal abnormality.  No fracture or dislocation. Other: No fluid collection or hematoma. IMPRESSION: 1. Tricompartmental cartilage abnormalities as described above most severe in the lateral femorotibial compartment consistent with  osteoarthritis. 2. Maceration of the anterior horn of the lateral meniscus. 3. Large joint effusion. Electronically Signed   By: Elige Ko   On: 04/11/2017  08:29     PMFS History: Patient Active Problem List   Diagnosis Date Noted  . Unilateral primary osteoarthritis, left knee 04/11/2017  . Effusion of right knee 03/29/2017   Past Medical History:  Diagnosis Date  . Allergy   . Depression   . Herpes   . Hx of blood clots 2000   left leg x1   . Hypertension   . Thyroid disease     Family History  Problem Relation Age of Onset  . Colon cancer Neg Hx   . Diabetes Mother   . Heart disease Mother   . Heart disease Father   . Kidney disease Father   . Prostate cancer Paternal Uncle   . Diabetes Maternal Grandfather   . Stroke Maternal Grandfather   . Ovarian cancer Paternal Grandfather   . Kidney disease Paternal Grandfather     Past Surgical History:  Procedure Laterality Date  . ABDOMINAL HYSTERECTOMY    . CESAREAN SECTION    . RETINAL DETACHMENT SURGERY    . THYROIDECTOMY    . WISDOM TOOTH EXTRACTION     Social History   Occupational History  . Not on file.   Social History Main Topics  . Smoking status: Never Smoker  . Smokeless tobacco: Never Used  . Alcohol use No  . Drug use: No  . Sexual activity: Yes

## 2017-04-16 DIAGNOSIS — F4325 Adjustment disorder with mixed disturbance of emotions and conduct: Secondary | ICD-10-CM | POA: Diagnosis not present

## 2017-04-16 DIAGNOSIS — K080 Exfoliation of teeth due to systemic causes: Secondary | ICD-10-CM | POA: Diagnosis not present

## 2017-04-18 DIAGNOSIS — F4310 Post-traumatic stress disorder, unspecified: Secondary | ICD-10-CM | POA: Diagnosis not present

## 2017-04-24 DIAGNOSIS — F4310 Post-traumatic stress disorder, unspecified: Secondary | ICD-10-CM | POA: Diagnosis not present

## 2017-05-15 DIAGNOSIS — F4325 Adjustment disorder with mixed disturbance of emotions and conduct: Secondary | ICD-10-CM | POA: Diagnosis not present

## 2017-05-17 DIAGNOSIS — B078 Other viral warts: Secondary | ICD-10-CM | POA: Diagnosis not present

## 2017-05-17 DIAGNOSIS — M1711 Unilateral primary osteoarthritis, right knee: Secondary | ICD-10-CM | POA: Diagnosis not present

## 2017-05-20 DIAGNOSIS — F4310 Post-traumatic stress disorder, unspecified: Secondary | ICD-10-CM | POA: Diagnosis not present

## 2017-06-04 DIAGNOSIS — F4310 Post-traumatic stress disorder, unspecified: Secondary | ICD-10-CM | POA: Diagnosis not present

## 2017-06-05 DIAGNOSIS — F4325 Adjustment disorder with mixed disturbance of emotions and conduct: Secondary | ICD-10-CM | POA: Diagnosis not present

## 2017-06-11 DIAGNOSIS — F4310 Post-traumatic stress disorder, unspecified: Secondary | ICD-10-CM | POA: Diagnosis not present

## 2017-06-12 DIAGNOSIS — M1711 Unilateral primary osteoarthritis, right knee: Secondary | ICD-10-CM | POA: Diagnosis not present

## 2017-06-18 DIAGNOSIS — F4310 Post-traumatic stress disorder, unspecified: Secondary | ICD-10-CM | POA: Diagnosis not present

## 2017-06-19 DIAGNOSIS — M1711 Unilateral primary osteoarthritis, right knee: Secondary | ICD-10-CM | POA: Diagnosis not present

## 2017-06-26 DIAGNOSIS — M1711 Unilateral primary osteoarthritis, right knee: Secondary | ICD-10-CM | POA: Diagnosis not present

## 2017-07-02 DIAGNOSIS — F4310 Post-traumatic stress disorder, unspecified: Secondary | ICD-10-CM | POA: Diagnosis not present

## 2017-07-03 DIAGNOSIS — F4325 Adjustment disorder with mixed disturbance of emotions and conduct: Secondary | ICD-10-CM | POA: Diagnosis not present

## 2017-07-04 DIAGNOSIS — H1789 Other corneal scars and opacities: Secondary | ICD-10-CM | POA: Diagnosis not present

## 2017-07-04 DIAGNOSIS — H5213 Myopia, bilateral: Secondary | ICD-10-CM | POA: Diagnosis not present

## 2017-07-17 DIAGNOSIS — F4310 Post-traumatic stress disorder, unspecified: Secondary | ICD-10-CM | POA: Diagnosis not present

## 2017-07-31 DIAGNOSIS — J302 Other seasonal allergic rhinitis: Secondary | ICD-10-CM | POA: Diagnosis not present

## 2017-07-31 DIAGNOSIS — Z566 Other physical and mental strain related to work: Secondary | ICD-10-CM | POA: Diagnosis not present

## 2017-07-31 DIAGNOSIS — I10 Essential (primary) hypertension: Secondary | ICD-10-CM | POA: Diagnosis not present

## 2017-07-31 DIAGNOSIS — R05 Cough: Secondary | ICD-10-CM | POA: Diagnosis not present

## 2017-08-07 DIAGNOSIS — M1711 Unilateral primary osteoarthritis, right knee: Secondary | ICD-10-CM | POA: Diagnosis not present

## 2017-08-12 DIAGNOSIS — F4310 Post-traumatic stress disorder, unspecified: Secondary | ICD-10-CM | POA: Diagnosis not present

## 2017-08-20 DIAGNOSIS — F4325 Adjustment disorder with mixed disturbance of emotions and conduct: Secondary | ICD-10-CM | POA: Diagnosis not present

## 2017-08-26 DIAGNOSIS — F4310 Post-traumatic stress disorder, unspecified: Secondary | ICD-10-CM | POA: Diagnosis not present

## 2017-08-28 DIAGNOSIS — F4310 Post-traumatic stress disorder, unspecified: Secondary | ICD-10-CM | POA: Diagnosis not present

## 2017-09-04 DIAGNOSIS — F4310 Post-traumatic stress disorder, unspecified: Secondary | ICD-10-CM | POA: Diagnosis not present

## 2017-09-13 DIAGNOSIS — F4310 Post-traumatic stress disorder, unspecified: Secondary | ICD-10-CM | POA: Diagnosis not present

## 2017-09-23 DIAGNOSIS — F4310 Post-traumatic stress disorder, unspecified: Secondary | ICD-10-CM | POA: Diagnosis not present

## 2017-10-01 DIAGNOSIS — F4325 Adjustment disorder with mixed disturbance of emotions and conduct: Secondary | ICD-10-CM | POA: Diagnosis not present

## 2017-10-16 DIAGNOSIS — F4325 Adjustment disorder with mixed disturbance of emotions and conduct: Secondary | ICD-10-CM | POA: Diagnosis not present

## 2017-10-25 DIAGNOSIS — F4310 Post-traumatic stress disorder, unspecified: Secondary | ICD-10-CM | POA: Diagnosis not present

## 2017-11-12 DIAGNOSIS — F4310 Post-traumatic stress disorder, unspecified: Secondary | ICD-10-CM | POA: Diagnosis not present

## 2017-11-18 ENCOUNTER — Emergency Department (HOSPITAL_COMMUNITY)
Admission: EM | Admit: 2017-11-18 | Discharge: 2017-11-18 | Disposition: A | Discharge status: Home / Self Care | Payer: Federal, State, Local not specified - PPO | Attending: Emergency Medicine | Admitting: Emergency Medicine

## 2017-11-18 ENCOUNTER — Emergency Department (HOSPITAL_COMMUNITY): Payer: Federal, State, Local not specified - PPO

## 2017-11-18 ENCOUNTER — Encounter (HOSPITAL_COMMUNITY): Payer: Self-pay

## 2017-11-18 ENCOUNTER — Other Ambulatory Visit: Payer: Self-pay

## 2017-11-18 DIAGNOSIS — Z79899 Other long term (current) drug therapy: Secondary | ICD-10-CM | POA: Diagnosis not present

## 2017-11-18 DIAGNOSIS — E860 Dehydration: Secondary | ICD-10-CM | POA: Diagnosis not present

## 2017-11-18 DIAGNOSIS — I10 Essential (primary) hypertension: Secondary | ICD-10-CM | POA: Insufficient documentation

## 2017-11-18 DIAGNOSIS — R000 Tachycardia, unspecified: Secondary | ICD-10-CM | POA: Diagnosis not present

## 2017-11-18 DIAGNOSIS — I951 Orthostatic hypotension: Secondary | ICD-10-CM

## 2017-11-18 DIAGNOSIS — Z7982 Long term (current) use of aspirin: Secondary | ICD-10-CM | POA: Diagnosis not present

## 2017-11-18 DIAGNOSIS — R0602 Shortness of breath: Secondary | ICD-10-CM | POA: Diagnosis not present

## 2017-11-18 DIAGNOSIS — R069 Unspecified abnormalities of breathing: Secondary | ICD-10-CM | POA: Diagnosis not present

## 2017-11-18 LAB — CBC
HCT: 44.4 % (ref 36.0–46.0)
Hemoglobin: 14.8 g/dL (ref 12.0–15.0)
MCH: 30.4 pg (ref 26.0–34.0)
MCHC: 33.3 g/dL (ref 30.0–36.0)
MCV: 91.2 fL (ref 78.0–100.0)
PLATELETS: 224 10*3/uL (ref 150–400)
RBC: 4.87 MIL/uL (ref 3.87–5.11)
RDW: 13.6 % (ref 11.5–15.5)
WBC: 5.6 10*3/uL (ref 4.0–10.5)

## 2017-11-18 LAB — COMPREHENSIVE METABOLIC PANEL
ALT: 18 U/L (ref 14–54)
ANION GAP: 10 (ref 5–15)
AST: 21 U/L (ref 15–41)
Albumin: 3.9 g/dL (ref 3.5–5.0)
Alkaline Phosphatase: 61 U/L (ref 38–126)
BUN: 28 mg/dL — AB (ref 6–20)
CHLORIDE: 109 mmol/L (ref 101–111)
CO2: 22 mmol/L (ref 22–32)
Calcium: 9.3 mg/dL (ref 8.9–10.3)
Creatinine, Ser: 0.76 mg/dL (ref 0.44–1.00)
GFR calc Af Amer: >60 mL/min (ref >60)
GFR calc non Af Amer: >60 mL/min (ref >60)
GLUCOSE: 122 mg/dL — AB (ref 65–99)
POTASSIUM: 3.6 mmol/L (ref 3.5–5.1)
Sodium: 141 mmol/L (ref 135–145)
Total Bilirubin: 0.5 mg/dL (ref 0.3–1.2)
Total Protein: 7.9 g/dL (ref 6.5–8.1)

## 2017-11-18 LAB — I-STAT TROPONIN, ED
TROPONIN I, POC: 0 ng/mL (ref 0.00–0.08)
TROPONIN I, POC: 0 ng/mL (ref 0.00–0.08)

## 2017-11-18 MED ORDER — IOPAMIDOL (ISOVUE-370) INJECTION 76%
INTRAVENOUS | Status: AC
  Filled 2017-11-18: qty 100

## 2017-11-18 MED ORDER — IOPAMIDOL (ISOVUE-370) INJECTION 76%
100.0000 mL | Freq: Once | INTRAVENOUS | Status: AC | PRN
  Administered 2017-11-18: 100 mL via INTRAVENOUS

## 2017-11-18 MED ORDER — SODIUM CHLORIDE 0.9 % IV BOLUS
1000.0000 mL | Freq: Once | INTRAVENOUS | Status: AC
Start: 2017-11-18 — End: 2017-11-18
  Administered 2017-11-18: 1000 mL via INTRAVENOUS
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
## 2017-11-18 NOTE — ED Notes (Signed)
Pt is alert and oriented x 4 and is verbally responsive. Pt denies pain at this time. Pt denies dizziness at this time.

## 2017-11-18 NOTE — ED Provider Notes (Signed)
[facility]  CSN: [id number] Arrival date & time: 11/18/17 0932  Chief Complaint(s) Shortness of Breath  HPI Courtney [name] is a 57 y.o. female   The history is provided by the patient.  Shortness of Breath  This is a new problem. The average episode lasts 2 hours. The problem occurs continuously.The problem has been gradually improving. Pertinent negatives include no fever, no rhinorrhea, no cough, no sputum production, no hemoptysis, no wheezing, no orthopnea, no chest pain, no leg pain and no leg swelling. Precipitated by: exertion. She has tried nothing for the symptoms. Associated medical issues include DVT. Associated medical issues do not include asthma, PE or CAD.  Near Syncope  This is a new problem. The current episode started 1 to 2 hours ago. The problem has been resolved. Associated symptoms include shortness of breath. Pertinent negatives include no chest pain. The symptoms are aggravated by exertion. Relieved by: being still.    Past Medical History Past Medical History:  Diagnosis Date  . Allergy   . Depression   . Herpes   . Hx of blood clots 2000   left leg x1   . Hypertension   . Thyroid disease    Patient Active Problem List   Diagnosis Date Noted  . Unilateral primary osteoarthritis, left knee 04/11/2017  . Effusion of right knee 03/29/2017   Home Medication(s) Prior to Admission medications   Medication Sig Start Date End Date Taking? Authorizing Provider  acetaminophen (TYLENOL) 650 MG CR tablet Take 650 mg by mouth every 8 (eight) hours as needed for pain.   Yes [provider]  amLODipine (NORVASC) 2.5 MG tablet Take 2.5 mg by mouth daily.   Yes [provider]  Ascorbic Acid (VITAMIN C) 1000 MG tablet Take 1,000 mg by mouth daily.   Yes [provider]  aspirin EC 81 MG tablet Take 81 mg by mouth daily.   Yes [provider]  ibuprofen (ADVIL,MOTRIN) 600 MG  tablet Take 1 tablet (600 mg total) by mouth every 6 (six) hours as needed. 03/30/15  Yes Arby Barrette, MD  levothyroxine (SYNTHROID, LEVOTHROID) 125 MCG tablet Take 125 mcg by mouth daily before breakfast. 6 days a week   Yes [provider]  loratadine (CLARITIN) 10 MG tablet Take 10 mg by mouth daily.   Yes [provider]  losartan-hydrochlorothiazide (HYZAAR) 50-12.5 MG tablet Take 1 tablet by mouth daily.   Yes [provider]  magnesium chloride (SLOW-MAG) 64 MG TBEC SR tablet Take by mouth.   Yes [provider]  TURMERIC PO Take by mouth.   Yes [provider]  valACYclovir (VALTREX) 500 MG tablet Take 500 mg by mouth daily.   Yes [provider]  vitamin E 100 UNIT capsule Take by mouth daily.   Yes [provider]  meloxicam (MOBIC) 7.5 MG tablet Take 1 tablet (7.5 mg total) by mouth 2 (two) times daily as needed for pain. Patient not taking: Reported on 11/18/2017 02/04/17   Tarry Kos, MD  Past Surgical History Past Surgical History:  Procedure Laterality Date  . ABDOMINAL HYSTERECTOMY    . CESAREAN SECTION    . RETINAL DETACHMENT SURGERY    . THYROIDECTOMY    . WISDOM TOOTH EXTRACTION     Family History Family History  Problem Relation Age of Onset  . Diabetes Mother   . Heart disease Mother   . Heart disease Father   . Kidney disease Father   . Prostate cancer Paternal Uncle   . Diabetes Maternal Grandfather   . Stroke Maternal Grandfather   . Ovarian cancer Paternal Grandfather   . Kidney disease Paternal Grandfather   . Colon cancer Neg Hx     Social History Social History   Tobacco Use  . Smoking status: Never Smoker  . Smokeless tobacco: Never Used  Substance Use Topics  . Alcohol use: No  . Drug use: No   Allergies Patient has no known allergies.  Review of  Systems Review of Systems  Constitutional: Negative for fever.  HENT: Negative for rhinorrhea.   Respiratory: Positive for shortness of breath. Negative for cough, hemoptysis, sputum production and wheezing.   Cardiovascular: Positive for near-syncope. Negative for chest pain, orthopnea and leg swelling.   All other systems are reviewed and are negative for acute change except as noted in the HPI  Physical Exam Vital Signs  I have reviewed the triage vital signs BP (!) 116/105 (BP Location: Left Arm)   Pulse 82   Temp 98.6 F (37 C)   Resp 18   SpO2 97%   Physical Exam  Constitutional: She is oriented to person, place, and time. She appears well-developed and well-nourished. No distress.  HENT:  Head: Normocephalic and atraumatic.  Nose: Nose normal.  Eyes: Pupils are equal, round, and reactive to light. Conjunctivae and EOM are normal. Right eye exhibits no discharge. Left eye exhibits no discharge. No scleral icterus.  Neck: Normal range of motion. Neck supple.  Cardiovascular: Regular rhythm. Tachycardia present. Exam reveals no gallop and no friction rub.  No murmur heard. Pulmonary/Chest: Effort normal and breath sounds normal. No stridor. No respiratory distress. She has no rales.  Abdominal: Soft. She exhibits no distension. There is no tenderness.  Musculoskeletal: She exhibits no edema or tenderness.  Neurological: She is alert and oriented to person, place, and time.  Skin: Skin is warm and dry. No rash noted. She is not diaphoretic. No erythema.  Psychiatric: She has a normal mood and affect.  Vitals reviewed.   ED Results and Treatments Labs (all labs ordered are listed, but only abnormal results are displayed) Labs Reviewed  COMPREHENSIVE METABOLIC PANEL - Abnormal; Notable for the following components:      Result Value   Glucose, Bld 122 (*)    BUN 28 (*)    All other components within normal limits  CBC  I-STAT TROPONIN, ED  I-STAT TROPONIN, ED  EKG  EKG Interpretation  Date/Time:  Monday November 18 2017 09:51:54 EDT Ventricular Rate:  113 PR Interval:    QRS Duration: 87 QT Interval:  344 QTC Calculation: 472 R Axis:   45 Text Interpretation:  Sinus tachycardia Otherwise no significant change Confirmed by Drema Pryardama, Pedro [phone]([id number]) on 11/18/2017 10:02:46 AM      Radiology Ct Angio Chest Pe W And/or Wo Contrast  Result Date: 11/18/2017 CLINICAL DATA:  Shortness of breath EXAM: CT ANGIOGRAPHY CHEST WITH CONTRAST TECHNIQUE: Multidetector CT imaging of the chest was performed using the standard protocol during bolus administration of intravenous contrast. Multiplanar CT image reconstructions and MIPs were obtained to evaluate the vascular anatomy. CONTRAST:  100mL ISOVUE-370 IOPAMIDOL (ISOVUE-370) INJECTION 76% COMPARISON:  None. FINDINGS: Cardiovascular: Thoracic aorta shows no evidence of aneurysmal dilatation or dissection. No significant atherosclerotic calcifications are seen. No coronary calcifications are noted. No significant cardiac enlargement is noted. The pulmonary artery is well visualize with a normal branching pattern. Some motion artifact limits evaluation of the lower lobe branches although no definitive pulmonary embolus is seen. Mediastinum/Nodes: Thoracic inlet is within normal limits. No hilar or mediastinal adenopathy is noted. The esophagus is unremarkable. Lungs/Pleura: Lungs are well aerated bilaterally without focal infiltrate or sizable effusion. Upper Abdomen: Visualized upper abdomen is unremarkable. Musculoskeletal: Degenerative changes of the thoracic spine are noted. No acute bony abnormality is seen. Review of the MIP images confirms the above findings. IMPRESSION: Mild motion artifact.  No significant pulmonary embolus is noted. No acute abnormality seen. Electronically Signed   By: Alcide CleverMark  Lukens M.D.   On:  11/18/2017 12:32   Pertinent labs & imaging results that were available during my care of the patient were reviewed by me and considered in my medical decision making (see chart for details).  Medications Ordered in ED Medications  iopamidol (ISOVUE-370) 76 % injection (has no administration in time range)  sodium chloride 0.9 % bolus 1,000 mL (0 mLs Intravenous Stopped 11/18/17 1142)  iopamidol (ISOVUE-370) 76 % injection 100 mL (100 mLs Intravenous Contrast Given 11/18/17 1206)                                                                                                                                    Procedures Procedures  (including critical care time)  Medical Decision Making / ED Course I have reviewed the nursing notes for this encounter and the patient's prior records (if available in EHR or on provided paperwork).    Dyspnea on exertion with  near syncopal symptoms.  Patient is noted to be tachycardic.  Reports prior history of DVTs.  Denies any chest pain currently.  Denies any shortness of breath.  Orthostatics were positive.  Possible dehydration however given history of prior blood clots, will obtain a CT scan to rule out PE.  EKG without acute ischemic changes.  Did note sinus tachycardia.  Atypical for ACS but given exertional shortness of  breath and history of hypertension, will rule out ACS with serial troponins x2.  Troponins negative x2.  Screening labs grossly reassuring without evidence of anemia, significant electrolyte derangements, renal insufficiency.   CTA negative for PE.  Patient provided with IV fluids resulting in complete resolution of her symptomatology.  Likely dehydration from a HCTZ. Close pcp follow up.  The patient appears reasonably screened and/or stabilized for discharge and I doubt any other medical condition or other [facility] requiring further screening, evaluation, or treatment in the ED at this time prior to discharge.  The patient is safe  for discharge with strict return precautions.   Final Clinical Impression(s) / ED Diagnoses Final diagnoses:  Orthostasis  Dehydration    Disposition: Discharge  Condition: Good  I have discussed the results, Dx and Tx plan with the patient who expressed understanding and agree(s) with the plan. Discharge instructions discussed at great length. The patient was given strict return precautions who verbalized understanding of the instructions. No further questions at time of discharge.    ED Discharge Orders    None       Follow Up: Jarome Matin, MD [address] [place] [address] [phone]  Schedule an appointment as soon as possible for a visit  to follow up for medication adjustment     This chart was dictated using voice recognition software.  Despite best efforts to proofread,  errors can occur which can change the documentation meaning.   Nira Conn, MD 11/18/17 1344

## 2017-11-18 NOTE — ED Notes (Signed)
Bed: WA05 Expected date:  Expected time:  Means of arrival:  Comments: EMS-SOB 

## 2017-11-19 DIAGNOSIS — R0609 Other forms of dyspnea: Secondary | ICD-10-CM | POA: Diagnosis not present

## 2017-11-19 DIAGNOSIS — I10 Essential (primary) hypertension: Secondary | ICD-10-CM | POA: Diagnosis not present

## 2017-11-19 DIAGNOSIS — R0600 Dyspnea, unspecified: Secondary | ICD-10-CM | POA: Diagnosis not present

## 2017-11-19 DIAGNOSIS — R05 Cough: Secondary | ICD-10-CM | POA: Diagnosis not present

## 2017-11-20 ENCOUNTER — Telehealth: Payer: Self-pay

## 2017-11-20 DIAGNOSIS — F4310 Post-traumatic stress disorder, unspecified: Secondary | ICD-10-CM | POA: Diagnosis not present

## 2017-11-20 NOTE — Telephone Encounter (Signed)
SENT REFERRAL TO SCHEDULING 

## 2017-11-28 DIAGNOSIS — F4325 Adjustment disorder with mixed disturbance of emotions and conduct: Secondary | ICD-10-CM | POA: Diagnosis not present

## 2017-12-03 DIAGNOSIS — I10 Essential (primary) hypertension: Secondary | ICD-10-CM | POA: Diagnosis not present

## 2017-12-03 DIAGNOSIS — Z6830 Body mass index (BMI) 30.0-30.9, adult: Secondary | ICD-10-CM | POA: Diagnosis not present

## 2017-12-06 DIAGNOSIS — F4310 Post-traumatic stress disorder, unspecified: Secondary | ICD-10-CM | POA: Diagnosis not present

## 2017-12-16 DIAGNOSIS — F4310 Post-traumatic stress disorder, unspecified: Secondary | ICD-10-CM | POA: Diagnosis not present

## 2017-12-24 DIAGNOSIS — I10 Essential (primary) hypertension: Secondary | ICD-10-CM | POA: Diagnosis not present

## 2017-12-24 DIAGNOSIS — F4325 Adjustment disorder with mixed disturbance of emotions and conduct: Secondary | ICD-10-CM | POA: Diagnosis not present

## 2017-12-24 DIAGNOSIS — Z6830 Body mass index (BMI) 30.0-30.9, adult: Secondary | ICD-10-CM | POA: Diagnosis not present

## 2017-12-24 DIAGNOSIS — E038 Other specified hypothyroidism: Secondary | ICD-10-CM | POA: Diagnosis not present

## 2018-01-17 DIAGNOSIS — M25561 Pain in right knee: Secondary | ICD-10-CM | POA: Diagnosis not present

## 2018-01-17 DIAGNOSIS — M1711 Unilateral primary osteoarthritis, right knee: Secondary | ICD-10-CM | POA: Diagnosis not present

## 2018-01-17 DIAGNOSIS — F4310 Post-traumatic stress disorder, unspecified: Secondary | ICD-10-CM | POA: Diagnosis not present

## 2018-01-20 NOTE — Progress Notes (Signed)
Cardiology Office Note   Date:  01/24/2018   ID:  Courtney [name], DOB Nov 29, 1960, MRN [id number]  PCP:  Jarome Matin, MD  Cardiologist:   Charlton Haws, MD   No chief complaint on file.     History of Present Illness: Courtney [name] is a 57 y.o. female who presents for consultation regarding HTN and dyspnea. Referred by DR Rosana Berger thyroidectomy on replacement Seen in ER 11/18/17 for dyspnea and near syncope Previous history of DVT left leg 2000;s No chest pain Dyspnea worse with exertion. Noted to be tachycardic in ER Troponin negative CTA no PE Thought to be dehydrated due to Rx BP with HCTZ Rx with fluids with improvement BUN mildly elevated 28 Cr .76 other labs normal including Hct 44.4   Does not think losartan is agreeing with her Dose lowered  I took care of her mom and dad Greggory Stallion and Sallye Ober for years Greggory Stallion passed of renal failure and Sallye Ober is now at [employer] last 5 years with dementia.  Past Medical History:  Diagnosis Date  . Allergy   . Depression   . Herpes   . Hx of blood clots 2000   left leg x1   . Hypertension   . Thyroid disease     Past Surgical History:  Procedure Laterality Date  . ABDOMINAL HYSTERECTOMY    . CESAREAN SECTION    . RETINAL DETACHMENT SURGERY    . THYROIDECTOMY    . WISDOM TOOTH EXTRACTION       Current Outpatient Medications  Medication Sig Dispense Refill  . acetaminophen (TYLENOL) 650 MG CR tablet Take 650 mg by mouth every 8 (eight) hours as needed for pain.    Marland Kitchen amLODipine (NORVASC) 2.5 MG tablet Take 2.5 mg by mouth daily.    . Ascorbic Acid (VITAMIN C) 1000 MG tablet Take 1,000 mg by mouth daily.    Marland Kitchen aspirin EC 81 MG tablet Take 81 mg by mouth daily.    Marland Kitchen ibuprofen (ADVIL,MOTRIN) 600 MG tablet Take 1 tablet (600 mg total) by mouth every 6 (six) hours as needed. 30 tablet 0  . levothyroxine (SYNTHROID, LEVOTHROID) 125 MCG tablet Take 125 mcg by mouth daily before breakfast. 6 days a week    .  loratadine (CLARITIN) 10 MG tablet Take 10 mg by mouth daily.    Marland Kitchen losartan-hydrochlorothiazide (HYZAAR) 50-12.5 MG tablet Take 1 tablet by mouth daily.    . magnesium chloride (SLOW-MAG) 64 MG TBEC SR tablet Take by mouth.    . meloxicam (MOBIC) 7.5 MG tablet Take 1 tablet (7.5 mg total) by mouth 2 (two) times daily as needed for pain. 30 tablet 2  . TURMERIC PO Take by mouth.    . valACYclovir (VALTREX) 500 MG tablet Take 500 mg by mouth daily.    . vitamin E 100 UNIT capsule Take by mouth daily.     No current facility-administered medications for this visit.     Allergies:   Patient has no known allergies.    Social History:  The patient  reports that she has never smoked. She has never used smokeless tobacco. She reports that she does not drink alcohol or use drugs.   Family History:  The patient's family history includes Diabetes in her maternal grandfather and mother; Heart disease in her father and mother; Kidney disease in her father and paternal grandfather; Ovarian cancer in her paternal grandfather; Prostate cancer in her paternal uncle; Stroke in her maternal grandfather.  ROS:  Please see the history of present illness.   Otherwise, review of systems are positive for none.   All other systems are reviewed and negative.    PHYSICAL EXAM: VS:  BP 126/80   Pulse 87   Ht 5\' 7"  (1.702 m)   Wt 195 lb 8 oz (88.7 kg)   SpO2 97%   BMI 30.62 kg/m  , BMI Body mass index is 30.62 kg/m. Affect appropriate Healthy:  appears stated age HEENT: normal Neck supple with no adenopathy JVP normal no bruits no thyromegaly Lungs clear with no wheezing and good diaphragmatic motion Heart:  S1/S2 no murmur, no rub, gallop or click PMI normal Abdomen: benighn, BS positve, no tenderness, no AAA no bruit.  No HSM or HJR Distal pulses intact with no bruits No edema Neuro non-focal Skin warm and dry No muscular weakness    EKG:  ST rate 113 otherwise normal 11/19/17    Recent  Labs: 11/18/2017: ALT 18; BUN 28; Creatinine, Ser 0.76; Hemoglobin 14.8; Platelets 224; Potassium 3.6; Sodium 141    Lipid Panel No results found for: CHOL, TRIG, HDL, CHOLHDL, VLDL, LDLCALC, LDLDIRECT    Wt Readings from Last 3 Encounters:  01/24/18 195 lb 8 oz (88.7 kg)  03/18/17 200 lb (90.7 kg)  10/28/15 193 lb (87.5 kg)      Other studies Reviewed: Additional studies/ records that were reviewed today include: Notes from Dr Jarold MottoPatterson ER visit April 2019 CTA, ECG, labs and physician notes.    ASSESSMENT AND PLAN:  1. Dyspnea ? Drug reaction will order TTE to assess RV/LV function  2. HTN labile ARB dose decreased will get ETT and make sure systolic BP With exercise not excessive and given dyspnea of unknown etiology r/o CAD 3. Dizziness resolved likely related to too high dose ARB 4. Tachycardia resolved likely related to anxiety  5. Thyroid:  Continue replacement labs with primary    Current medicines are reviewed at length with the patient today.  The patient does not have concerns regarding medicines.  The following changes have been made:  no change  Labs/ tests ordered today include: TTE ETT   Orders Placed This Encounter  Procedures  . EXERCISE TOLERANCE TEST (ETT)  . ECHOCARDIOGRAM COMPLETE     Disposition:   FU with cardiology PRN      Signed, Charlton HawsPeter Dyllan Kats, MD  01/24/2018 11:06 AM    [facility] [address], [place], [place]  [address] Phone: [phone]; Fax: [phone]

## 2018-01-24 ENCOUNTER — Ambulatory Visit (INDEPENDENT_AMBULATORY_CARE_PROVIDER_SITE_OTHER): Payer: Federal, State, Local not specified - PPO | Admitting: Cardiovascular Disease

## 2018-01-24 ENCOUNTER — Encounter: Payer: Self-pay | Admitting: Cardiovascular Disease

## 2018-01-24 ENCOUNTER — Encounter

## 2018-01-24 ENCOUNTER — Encounter (INDEPENDENT_AMBULATORY_CARE_PROVIDER_SITE_OTHER): Payer: Self-pay

## 2018-01-24 VITALS — BP 126/80 | HR 87 | Ht 67.0 in | Wt 195.5 lb

## 2018-01-24 DIAGNOSIS — R000 Tachycardia, unspecified: Secondary | ICD-10-CM

## 2018-01-24 DIAGNOSIS — R0600 Dyspnea, unspecified: Secondary | ICD-10-CM

## 2018-01-24 DIAGNOSIS — I10 Essential (primary) hypertension: Secondary | ICD-10-CM

## 2018-01-24 NOTE — Patient Instructions (Addendum)
Medication Instructions:  Your physician recommends that you continue on your current medications as directed. Please refer to the Current Medication list given to you today.  Labwork: NONE  Testing/Procedures: Your physician has requested that you have an echocardiogram. Echocardiography is a painless test that uses sound waves to create images of your heart. It provides your doctor with information about the size and shape of your heart and how well your heart's chambers and valves are working. This procedure takes approximately one hour. There are no restrictions for this procedure.  Your physician has requested that you have an exercise tolerance test. For further information please visit www.[email or web address]. Please also follow instruction sheet, as given.  Follow-Up: Your physician wants you to follow-up as needed with Dr. Nishan.    If you need a refill on your cardiac medications before your next appointment, please call your pharmacy.    

## 2018-02-04 DIAGNOSIS — Z6829 Body mass index (BMI) 29.0-29.9, adult: Secondary | ICD-10-CM | POA: Diagnosis not present

## 2018-02-04 DIAGNOSIS — R0600 Dyspnea, unspecified: Secondary | ICD-10-CM | POA: Diagnosis not present

## 2018-02-04 DIAGNOSIS — F419 Anxiety disorder, unspecified: Secondary | ICD-10-CM | POA: Diagnosis not present

## 2018-02-04 DIAGNOSIS — I10 Essential (primary) hypertension: Secondary | ICD-10-CM | POA: Diagnosis not present

## 2018-02-05 ENCOUNTER — Other Ambulatory Visit: Payer: Self-pay

## 2018-02-05 ENCOUNTER — Ambulatory Visit (HOSPITAL_COMMUNITY): Payer: Federal, State, Local not specified - PPO | Attending: Internal Medicine

## 2018-02-05 ENCOUNTER — Ambulatory Visit (INDEPENDENT_AMBULATORY_CARE_PROVIDER_SITE_OTHER): Payer: Federal, State, Local not specified - PPO

## 2018-02-05 DIAGNOSIS — R000 Tachycardia, unspecified: Secondary | ICD-10-CM | POA: Diagnosis not present

## 2018-02-05 DIAGNOSIS — I10 Essential (primary) hypertension: Secondary | ICD-10-CM | POA: Diagnosis not present

## 2018-02-05 DIAGNOSIS — I071 Rheumatic tricuspid insufficiency: Secondary | ICD-10-CM | POA: Insufficient documentation

## 2018-02-05 DIAGNOSIS — R0600 Dyspnea, unspecified: Secondary | ICD-10-CM | POA: Insufficient documentation

## 2018-02-05 LAB — EXERCISE TOLERANCE TEST
CSEPED: 5 min
CSEPEDS: 50 s
CSEPHR: 89 %
CSEPPHR: 148 {beats}/min
Estimated workload: 7 METS
MPHR: 165 {beats}/min
RPE: 17
Rest HR: 78 {beats}/min

## 2018-02-12 DIAGNOSIS — F4325 Adjustment disorder with mixed disturbance of emotions and conduct: Secondary | ICD-10-CM | POA: Diagnosis not present

## 2018-02-18 DIAGNOSIS — Z6829 Body mass index (BMI) 29.0-29.9, adult: Secondary | ICD-10-CM | POA: Diagnosis not present

## 2018-02-18 DIAGNOSIS — I10 Essential (primary) hypertension: Secondary | ICD-10-CM | POA: Diagnosis not present

## 2018-02-20 DIAGNOSIS — F4310 Post-traumatic stress disorder, unspecified: Secondary | ICD-10-CM | POA: Diagnosis not present

## 2018-03-05 ENCOUNTER — Ambulatory Visit (INDEPENDENT_AMBULATORY_CARE_PROVIDER_SITE_OTHER): Payer: Federal, State, Local not specified - PPO | Admitting: Orthopaedic Surgery

## 2018-03-06 DIAGNOSIS — I10 Essential (primary) hypertension: Secondary | ICD-10-CM | POA: Diagnosis not present

## 2018-03-10 DIAGNOSIS — F4310 Post-traumatic stress disorder, unspecified: Secondary | ICD-10-CM | POA: Diagnosis not present

## 2018-03-12 DIAGNOSIS — M1711 Unilateral primary osteoarthritis, right knee: Secondary | ICD-10-CM | POA: Diagnosis not present

## 2018-03-17 DIAGNOSIS — Z6829 Body mass index (BMI) 29.0-29.9, adult: Secondary | ICD-10-CM | POA: Diagnosis not present

## 2018-03-17 DIAGNOSIS — M545 Low back pain: Secondary | ICD-10-CM | POA: Diagnosis not present

## 2018-03-17 DIAGNOSIS — I10 Essential (primary) hypertension: Secondary | ICD-10-CM | POA: Diagnosis not present

## 2018-03-19 DIAGNOSIS — M1711 Unilateral primary osteoarthritis, right knee: Secondary | ICD-10-CM | POA: Diagnosis not present

## 2018-03-19 DIAGNOSIS — I10 Essential (primary) hypertension: Secondary | ICD-10-CM | POA: Diagnosis not present

## 2018-03-26 DIAGNOSIS — M1711 Unilateral primary osteoarthritis, right knee: Secondary | ICD-10-CM | POA: Diagnosis not present

## 2018-03-31 DIAGNOSIS — I10 Essential (primary) hypertension: Secondary | ICD-10-CM | POA: Diagnosis not present

## 2018-03-31 DIAGNOSIS — Z7189 Other specified counseling: Secondary | ICD-10-CM | POA: Diagnosis not present

## 2018-03-31 DIAGNOSIS — M545 Low back pain: Secondary | ICD-10-CM | POA: Diagnosis not present

## 2018-03-31 DIAGNOSIS — Z6830 Body mass index (BMI) 30.0-30.9, adult: Secondary | ICD-10-CM | POA: Diagnosis not present

## 2018-04-03 DIAGNOSIS — F4310 Post-traumatic stress disorder, unspecified: Secondary | ICD-10-CM | POA: Diagnosis not present

## 2018-04-14 DIAGNOSIS — I10 Essential (primary) hypertension: Secondary | ICD-10-CM | POA: Diagnosis not present

## 2018-04-14 DIAGNOSIS — Z6830 Body mass index (BMI) 30.0-30.9, adult: Secondary | ICD-10-CM | POA: Diagnosis not present

## 2018-04-14 DIAGNOSIS — F4310 Post-traumatic stress disorder, unspecified: Secondary | ICD-10-CM | POA: Diagnosis not present

## 2018-04-14 DIAGNOSIS — Z7189 Other specified counseling: Secondary | ICD-10-CM | POA: Diagnosis not present

## 2018-04-29 DIAGNOSIS — F4325 Adjustment disorder with mixed disturbance of emotions and conduct: Secondary | ICD-10-CM | POA: Diagnosis not present

## 2018-05-07 DIAGNOSIS — F4310 Post-traumatic stress disorder, unspecified: Secondary | ICD-10-CM | POA: Diagnosis not present

## 2018-05-15 DIAGNOSIS — R82998 Other abnormal findings in urine: Secondary | ICD-10-CM | POA: Diagnosis not present

## 2018-05-15 DIAGNOSIS — Z0000 Encounter for general adult medical examination without abnormal findings: Secondary | ICD-10-CM | POA: Diagnosis not present

## 2018-05-15 DIAGNOSIS — I10 Essential (primary) hypertension: Secondary | ICD-10-CM | POA: Diagnosis not present

## 2018-05-15 DIAGNOSIS — E038 Other specified hypothyroidism: Secondary | ICD-10-CM | POA: Diagnosis not present

## 2018-05-23 DIAGNOSIS — R51 Headache: Secondary | ICD-10-CM | POA: Diagnosis not present

## 2018-05-23 DIAGNOSIS — F4325 Adjustment disorder with mixed disturbance of emotions and conduct: Secondary | ICD-10-CM | POA: Diagnosis not present

## 2018-05-23 DIAGNOSIS — F418 Other specified anxiety disorders: Secondary | ICD-10-CM | POA: Diagnosis not present

## 2018-05-23 DIAGNOSIS — I10 Essential (primary) hypertension: Secondary | ICD-10-CM | POA: Diagnosis not present

## 2018-05-23 DIAGNOSIS — Z1389 Encounter for screening for other disorder: Secondary | ICD-10-CM | POA: Diagnosis not present

## 2018-05-23 DIAGNOSIS — M25561 Pain in right knee: Secondary | ICD-10-CM | POA: Diagnosis not present

## 2018-05-23 DIAGNOSIS — Z0000 Encounter for general adult medical examination without abnormal findings: Secondary | ICD-10-CM | POA: Diagnosis not present

## 2018-05-28 ENCOUNTER — Other Ambulatory Visit: Payer: Self-pay | Admitting: Internal Medicine

## 2018-05-28 DIAGNOSIS — Z1231 Encounter for screening mammogram for malignant neoplasm of breast: Secondary | ICD-10-CM

## 2018-05-31 DIAGNOSIS — R609 Edema, unspecified: Secondary | ICD-10-CM | POA: Diagnosis not present

## 2018-06-05 DIAGNOSIS — R0609 Other forms of dyspnea: Secondary | ICD-10-CM | POA: Diagnosis not present

## 2018-06-05 DIAGNOSIS — R221 Localized swelling, mass and lump, neck: Secondary | ICD-10-CM | POA: Diagnosis not present

## 2018-06-05 DIAGNOSIS — Z6830 Body mass index (BMI) 30.0-30.9, adult: Secondary | ICD-10-CM | POA: Diagnosis not present

## 2018-06-10 DIAGNOSIS — K080 Exfoliation of teeth due to systemic causes: Secondary | ICD-10-CM | POA: Diagnosis not present

## 2018-06-16 DIAGNOSIS — R221 Localized swelling, mass and lump, neck: Secondary | ICD-10-CM | POA: Diagnosis not present

## 2018-06-16 DIAGNOSIS — Z8639 Personal history of other endocrine, nutritional and metabolic disease: Secondary | ICD-10-CM | POA: Diagnosis not present

## 2018-06-17 DIAGNOSIS — F4310 Post-traumatic stress disorder, unspecified: Secondary | ICD-10-CM | POA: Diagnosis not present

## 2018-06-19 ENCOUNTER — Other Ambulatory Visit: Payer: Self-pay | Admitting: Otolaryngology

## 2018-06-19 DIAGNOSIS — R221 Localized swelling, mass and lump, neck: Secondary | ICD-10-CM

## 2018-06-19 DIAGNOSIS — Q892 Congenital malformations of other endocrine glands: Secondary | ICD-10-CM

## 2018-06-24 ENCOUNTER — Ambulatory Visit
Admission: RE | Admit: 2018-06-24 | Discharge: 2018-06-24 | Disposition: A | Discharge status: Home / Self Care | Payer: Federal, State, Local not specified - PPO | Source: Ambulatory Visit | Attending: Otolaryngology | Admitting: Otolaryngology

## 2018-06-24 DIAGNOSIS — J351 Hypertrophy of tonsils: Secondary | ICD-10-CM | POA: Diagnosis not present

## 2018-06-24 DIAGNOSIS — Q892 Congenital malformations of other endocrine glands: Secondary | ICD-10-CM

## 2018-06-24 DIAGNOSIS — R221 Localized swelling, mass and lump, neck: Secondary | ICD-10-CM

## 2018-06-24 MED ORDER — IOPAMIDOL (ISOVUE-300) INJECTION 61%
75.0000 mL | Freq: Once | INTRAVENOUS | Status: AC | PRN
  Administered 2018-06-24: 75 mL via INTRAVENOUS

## 2018-06-25 DIAGNOSIS — F4325 Adjustment disorder with mixed disturbance of emotions and conduct: Secondary | ICD-10-CM | POA: Diagnosis not present

## 2018-07-14 ENCOUNTER — Ambulatory Visit
Admission: RE | Admit: 2018-07-14 | Discharge: 2018-07-14 | Disposition: A | Discharge status: Home / Self Care | Payer: Federal, State, Local not specified - PPO | Source: Ambulatory Visit | Attending: Internal Medicine | Admitting: Internal Medicine

## 2018-07-14 DIAGNOSIS — Z1231 Encounter for screening mammogram for malignant neoplasm of breast: Secondary | ICD-10-CM

## 2018-07-14 DIAGNOSIS — Z6829 Body mass index (BMI) 29.0-29.9, adult: Secondary | ICD-10-CM | POA: Diagnosis not present

## 2018-07-14 DIAGNOSIS — R221 Localized swelling, mass and lump, neck: Secondary | ICD-10-CM | POA: Diagnosis not present

## 2018-07-14 DIAGNOSIS — F4310 Post-traumatic stress disorder, unspecified: Secondary | ICD-10-CM | POA: Diagnosis not present

## 2018-07-14 DIAGNOSIS — I10 Essential (primary) hypertension: Secondary | ICD-10-CM | POA: Diagnosis not present

## 2018-07-22 DIAGNOSIS — H5213 Myopia, bilateral: Secondary | ICD-10-CM | POA: Diagnosis not present

## 2018-07-22 DIAGNOSIS — H52203 Unspecified astigmatism, bilateral: Secondary | ICD-10-CM | POA: Diagnosis not present

## 2018-07-22 DIAGNOSIS — H31002 Unspecified chorioretinal scars, left eye: Secondary | ICD-10-CM | POA: Diagnosis not present

## 2018-07-22 DIAGNOSIS — H524 Presbyopia: Secondary | ICD-10-CM | POA: Diagnosis not present

## 2018-07-25 ENCOUNTER — Other Ambulatory Visit: Payer: Self-pay | Admitting: Otolaryngology

## 2018-07-25 DIAGNOSIS — R221 Localized swelling, mass and lump, neck: Secondary | ICD-10-CM | POA: Diagnosis not present

## 2018-07-25 DIAGNOSIS — Q892 Congenital malformations of other endocrine glands: Secondary | ICD-10-CM | POA: Diagnosis not present

## 2018-08-25 DIAGNOSIS — F4310 Post-traumatic stress disorder, unspecified: Secondary | ICD-10-CM | POA: Diagnosis not present

## 2018-08-28 DIAGNOSIS — F4325 Adjustment disorder with mixed disturbance of emotions and conduct: Secondary | ICD-10-CM | POA: Diagnosis not present

## 2018-09-01 DIAGNOSIS — F4325 Adjustment disorder with mixed disturbance of emotions and conduct: Secondary | ICD-10-CM | POA: Diagnosis not present

## 2018-09-10 DIAGNOSIS — F4310 Post-traumatic stress disorder, unspecified: Secondary | ICD-10-CM | POA: Diagnosis not present

## 2018-09-22 DIAGNOSIS — F4325 Adjustment disorder with mixed disturbance of emotions and conduct: Secondary | ICD-10-CM | POA: Diagnosis not present

## 2018-09-26 DIAGNOSIS — F4310 Post-traumatic stress disorder, unspecified: Secondary | ICD-10-CM | POA: Diagnosis not present

## 2018-10-02 DIAGNOSIS — M1711 Unilateral primary osteoarthritis, right knee: Secondary | ICD-10-CM | POA: Diagnosis not present

## 2018-10-02 DIAGNOSIS — M170 Bilateral primary osteoarthritis of knee: Secondary | ICD-10-CM | POA: Diagnosis not present

## 2018-10-09 DIAGNOSIS — M1711 Unilateral primary osteoarthritis, right knee: Secondary | ICD-10-CM | POA: Diagnosis not present

## 2018-10-16 DIAGNOSIS — M1711 Unilateral primary osteoarthritis, right knee: Secondary | ICD-10-CM | POA: Diagnosis not present

## 2018-10-30 DIAGNOSIS — F4310 Post-traumatic stress disorder, unspecified: Secondary | ICD-10-CM | POA: Diagnosis not present

## 2018-11-07 DIAGNOSIS — F4325 Adjustment disorder with mixed disturbance of emotions and conduct: Secondary | ICD-10-CM | POA: Diagnosis not present

## 2018-11-17 DIAGNOSIS — F4310 Post-traumatic stress disorder, unspecified: Secondary | ICD-10-CM | POA: Diagnosis not present

## 2018-11-25 DIAGNOSIS — F4310 Post-traumatic stress disorder, unspecified: Secondary | ICD-10-CM | POA: Diagnosis not present

## 2018-12-03 DIAGNOSIS — F4325 Adjustment disorder with mixed disturbance of emotions and conduct: Secondary | ICD-10-CM | POA: Diagnosis not present

## 2018-12-11 DIAGNOSIS — F4310 Post-traumatic stress disorder, unspecified: Secondary | ICD-10-CM | POA: Diagnosis not present

## 2018-12-19 DIAGNOSIS — F4325 Adjustment disorder with mixed disturbance of emotions and conduct: Secondary | ICD-10-CM | POA: Diagnosis not present

## 2019-01-14 DIAGNOSIS — F4325 Adjustment disorder with mixed disturbance of emotions and conduct: Secondary | ICD-10-CM | POA: Diagnosis not present

## 2019-01-22 DIAGNOSIS — F4310 Post-traumatic stress disorder, unspecified: Secondary | ICD-10-CM | POA: Diagnosis not present

## 2019-01-29 DIAGNOSIS — F4310 Post-traumatic stress disorder, unspecified: Secondary | ICD-10-CM | POA: Diagnosis not present

## 2019-02-04 DIAGNOSIS — F4325 Adjustment disorder with mixed disturbance of emotions and conduct: Secondary | ICD-10-CM | POA: Diagnosis not present

## 2019-02-11 DIAGNOSIS — F4310 Post-traumatic stress disorder, unspecified: Secondary | ICD-10-CM | POA: Diagnosis not present

## 2019-02-25 DIAGNOSIS — F4310 Post-traumatic stress disorder, unspecified: Secondary | ICD-10-CM | POA: Diagnosis not present

## 2019-03-04 DIAGNOSIS — F4325 Adjustment disorder with mixed disturbance of emotions and conduct: Secondary | ICD-10-CM | POA: Diagnosis not present

## 2019-03-26 DIAGNOSIS — F4325 Adjustment disorder with mixed disturbance of emotions and conduct: Secondary | ICD-10-CM | POA: Diagnosis not present

## 2019-03-30 DIAGNOSIS — F4310 Post-traumatic stress disorder, unspecified: Secondary | ICD-10-CM | POA: Diagnosis not present

## 2019-04-02 DIAGNOSIS — F4325 Adjustment disorder with mixed disturbance of emotions and conduct: Secondary | ICD-10-CM | POA: Diagnosis not present

## 2019-04-23 DIAGNOSIS — M1711 Unilateral primary osteoarthritis, right knee: Secondary | ICD-10-CM | POA: Diagnosis not present

## 2019-04-30 DIAGNOSIS — M1711 Unilateral primary osteoarthritis, right knee: Secondary | ICD-10-CM | POA: Diagnosis not present

## 2019-05-07 DIAGNOSIS — M1711 Unilateral primary osteoarthritis, right knee: Secondary | ICD-10-CM | POA: Diagnosis not present

## 2019-05-28 DIAGNOSIS — Z0000 Encounter for general adult medical examination without abnormal findings: Secondary | ICD-10-CM | POA: Diagnosis not present

## 2019-05-28 DIAGNOSIS — E038 Other specified hypothyroidism: Secondary | ICD-10-CM | POA: Diagnosis not present

## 2019-06-01 DIAGNOSIS — F4325 Adjustment disorder with mixed disturbance of emotions and conduct: Secondary | ICD-10-CM | POA: Diagnosis not present

## 2019-06-04 DIAGNOSIS — Z0000 Encounter for general adult medical examination without abnormal findings: Secondary | ICD-10-CM | POA: Diagnosis not present

## 2019-06-04 DIAGNOSIS — Z566 Other physical and mental strain related to work: Secondary | ICD-10-CM | POA: Diagnosis not present

## 2019-06-04 DIAGNOSIS — F419 Anxiety disorder, unspecified: Secondary | ICD-10-CM | POA: Diagnosis not present

## 2019-06-04 DIAGNOSIS — E039 Hypothyroidism, unspecified: Secondary | ICD-10-CM | POA: Diagnosis not present

## 2019-06-04 DIAGNOSIS — Z1331 Encounter for screening for depression: Secondary | ICD-10-CM | POA: Diagnosis not present

## 2019-06-04 DIAGNOSIS — I10 Essential (primary) hypertension: Secondary | ICD-10-CM | POA: Diagnosis not present

## 2019-06-17 ENCOUNTER — Other Ambulatory Visit: Payer: Self-pay | Admitting: Internal Medicine

## 2019-06-17 DIAGNOSIS — Z1231 Encounter for screening mammogram for malignant neoplasm of breast: Secondary | ICD-10-CM

## 2019-06-17 DIAGNOSIS — F4325 Adjustment disorder with mixed disturbance of emotions and conduct: Secondary | ICD-10-CM | POA: Diagnosis not present

## 2019-07-21 DIAGNOSIS — F4325 Adjustment disorder with mixed disturbance of emotions and conduct: Secondary | ICD-10-CM | POA: Diagnosis not present

## 2019-08-04 DIAGNOSIS — H2512 Age-related nuclear cataract, left eye: Secondary | ICD-10-CM | POA: Diagnosis not present

## 2019-08-04 DIAGNOSIS — H5213 Myopia, bilateral: Secondary | ICD-10-CM | POA: Diagnosis not present

## 2019-08-11 DIAGNOSIS — F4325 Adjustment disorder with mixed disturbance of emotions and conduct: Secondary | ICD-10-CM | POA: Diagnosis not present

## 2019-08-13 ENCOUNTER — Other Ambulatory Visit: Payer: Self-pay

## 2019-08-13 ENCOUNTER — Ambulatory Visit
Admission: RE | Admit: 2019-08-13 | Discharge: 2019-08-13 | Disposition: A | Discharge status: Home / Self Care | Payer: Federal, State, Local not specified - PPO | Source: Ambulatory Visit | Attending: Internal Medicine | Admitting: Internal Medicine

## 2019-08-13 DIAGNOSIS — Z1231 Encounter for screening mammogram for malignant neoplasm of breast: Secondary | ICD-10-CM

## 2019-08-25 DIAGNOSIS — F4325 Adjustment disorder with mixed disturbance of emotions and conduct: Secondary | ICD-10-CM | POA: Diagnosis not present

## 2019-09-14 DIAGNOSIS — F4325 Adjustment disorder with mixed disturbance of emotions and conduct: Secondary | ICD-10-CM | POA: Diagnosis not present

## 2019-09-25 DIAGNOSIS — H2513 Age-related nuclear cataract, bilateral: Secondary | ICD-10-CM | POA: Diagnosis not present

## 2019-09-30 DIAGNOSIS — F4325 Adjustment disorder with mixed disturbance of emotions and conduct: Secondary | ICD-10-CM | POA: Diagnosis not present

## 2019-10-07 DIAGNOSIS — H25812 Combined forms of age-related cataract, left eye: Secondary | ICD-10-CM | POA: Diagnosis not present

## 2019-10-07 DIAGNOSIS — H2512 Age-related nuclear cataract, left eye: Secondary | ICD-10-CM | POA: Diagnosis not present

## 2019-10-30 DIAGNOSIS — F4325 Adjustment disorder with mixed disturbance of emotions and conduct: Secondary | ICD-10-CM | POA: Diagnosis not present

## 2019-11-02 DIAGNOSIS — H43812 Vitreous degeneration, left eye: Secondary | ICD-10-CM | POA: Diagnosis not present

## 2019-11-03 DIAGNOSIS — H31009 Unspecified chorioretinal scars, unspecified eye: Secondary | ICD-10-CM | POA: Diagnosis not present

## 2019-11-03 DIAGNOSIS — Z8669 Personal history of other diseases of the nervous system and sense organs: Secondary | ICD-10-CM | POA: Diagnosis not present

## 2019-11-03 DIAGNOSIS — H43813 Vitreous degeneration, bilateral: Secondary | ICD-10-CM | POA: Diagnosis not present

## 2019-11-03 DIAGNOSIS — H2511 Age-related nuclear cataract, right eye: Secondary | ICD-10-CM | POA: Diagnosis not present

## 2019-11-05 ENCOUNTER — Ambulatory Visit: Payer: Federal, State, Local not specified - PPO | Attending: Internal Medicine

## 2019-11-05 DIAGNOSIS — Z23 Encounter for immunization: Secondary | ICD-10-CM

## 2019-11-05 NOTE — Progress Notes (Signed)
   Covid-19 Vaccination Clinic  Name:  [name]    MRN: [id number] DOB: 02-Jul-1961  11/05/2019  [name] was observed post Covid-19 immunization for 15 minutes without incident. She was provided with Vaccine Information Sheet and instruction to access the V-Safe system.   [name] was instructed to call 911 with any severe reactions post vaccine: Marland Kitchen Difficulty breathing  . Swelling of face and throat  . A fast heartbeat  . A bad rash all over body  . Dizziness and weakness   Immunizations Administered    Name Date Dose VIS Date Route   Pfizer COVID-19 Vaccine 11/05/2019  2:06 PM 0.3 mL 07/17/2019 Intramuscular   Manufacturer: [employer], [employer]   Lot: [id number]   NDC: [address]-4    pt reported no issues

## 2019-11-16 DIAGNOSIS — F4325 Adjustment disorder with mixed disturbance of emotions and conduct: Secondary | ICD-10-CM | POA: Diagnosis not present

## 2019-11-30 ENCOUNTER — Ambulatory Visit: Payer: Federal, State, Local not specified - PPO | Attending: Internal Medicine

## 2019-11-30 DIAGNOSIS — Z23 Encounter for immunization: Secondary | ICD-10-CM

## 2019-11-30 NOTE — Progress Notes (Signed)
   Covid-19 Vaccination Clinic  Name:  Courtney [name]    MRN: [id number] DOB: 1961/03/14  11/30/2019  [name] was observed post Covid-19 immunization for 15 minutes without incident. She was provided with Vaccine Information Sheet and instruction to access the V-Safe system.   [name] was instructed to call 911 with any severe reactions post vaccine: Marland Kitchen Difficulty breathing  . Swelling of face and throat  . A fast heartbeat  . A bad rash all over body  . Dizziness and weakness   Immunizations Administered    Name Date Dose VIS Date Route   Pfizer COVID-19 Vaccine 11/30/2019  3:09 PM 0.3 mL 09/30/2018 Intramuscular   Manufacturer: [employer], [employer]   Lot: [id number]   NDC: [address]-9

## 2019-12-18 DIAGNOSIS — F4325 Adjustment disorder with mixed disturbance of emotions and conduct: Secondary | ICD-10-CM | POA: Diagnosis not present

## 2019-12-24 DIAGNOSIS — M1711 Unilateral primary osteoarthritis, right knee: Secondary | ICD-10-CM | POA: Diagnosis not present

## 2019-12-31 DIAGNOSIS — M1711 Unilateral primary osteoarthritis, right knee: Secondary | ICD-10-CM | POA: Diagnosis not present

## 2020-01-06 DIAGNOSIS — F4325 Adjustment disorder with mixed disturbance of emotions and conduct: Secondary | ICD-10-CM | POA: Diagnosis not present

## 2020-01-07 DIAGNOSIS — M1711 Unilateral primary osteoarthritis, right knee: Secondary | ICD-10-CM | POA: Diagnosis not present

## 2020-02-04 DIAGNOSIS — F4325 Adjustment disorder with mixed disturbance of emotions and conduct: Secondary | ICD-10-CM | POA: Diagnosis not present

## 2020-03-08 DIAGNOSIS — H2511 Age-related nuclear cataract, right eye: Secondary | ICD-10-CM | POA: Diagnosis not present

## 2020-03-08 DIAGNOSIS — H04123 Dry eye syndrome of bilateral lacrimal glands: Secondary | ICD-10-CM | POA: Diagnosis not present

## 2020-06-03 IMAGING — CT CT NECK W/ CM
2 of 5 series · 4 of 14 positions shown, 5 images · IV contrast (iopamidol)
Comparison: None.

CLINICAL DATA: Thyroglossal duct cyst. Anterior neck mass. Postop
thyroidectomy.

EXAM:
CT NECK WITH CONTRAST
TECHNIQUE: Multidetector CT imaging of the neck was performed using the
standard protocol following the bolus administration of intravenous
contrast.
CONTRAST:  75mL CK2KXR-S44 IOPAMIDOL (CK2KXR-S44) INJECTION 61%

[Series 4: bone · axial · 0.50mm/px · z∈[-218,-134]mm · 2 of 128 slices shown]
[im 43/128  bone]
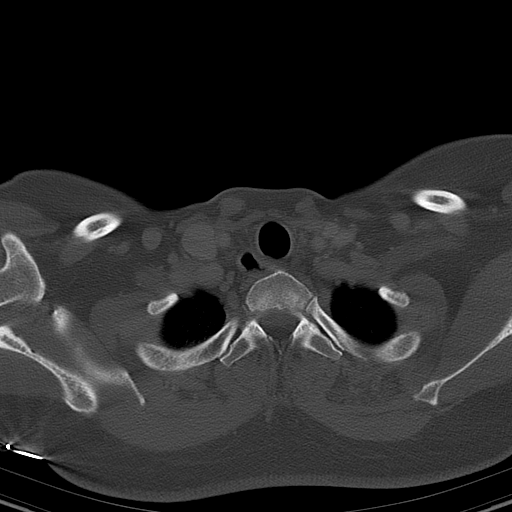
[im 85/128  bone]
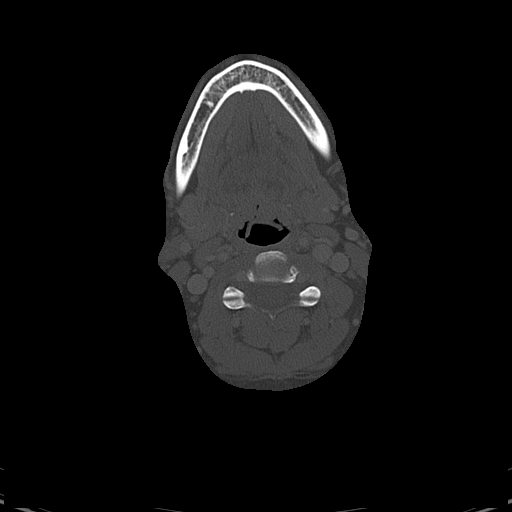

[Series 9: angled axial-oropharynx · axial · 0.39mm/px · z∈[-235,-152]mm · 2 of 128 slices shown, 3 images]
[im 43/128  soft-tissue]
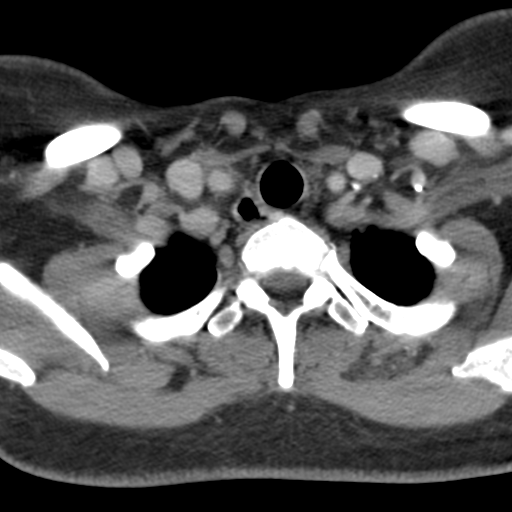
[im 43/128  bone]
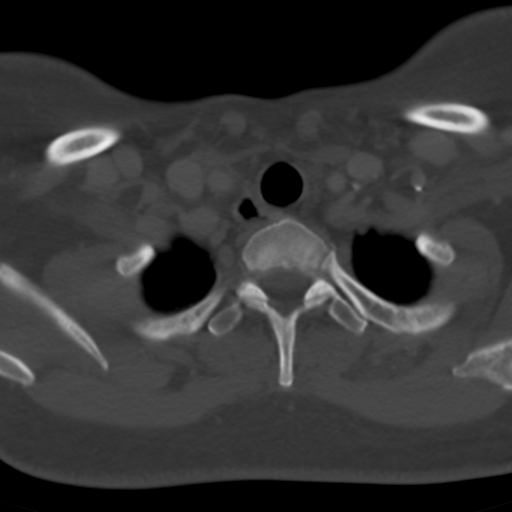
[im 85/128  bone]
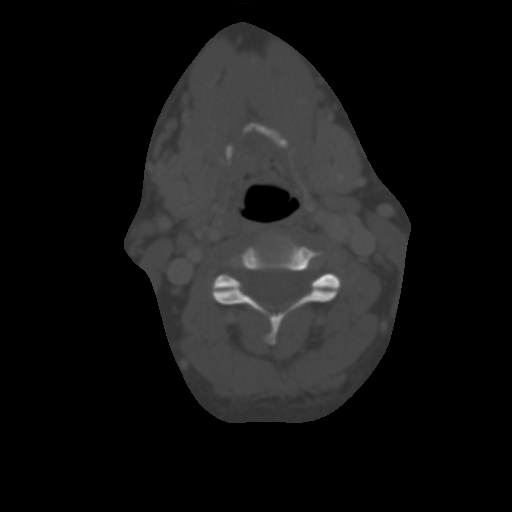

[4 of 14 positions shown; findings below may reference images not displayed]

FINDINGS: Pharynx and larynx: Mild hypertrophy of the tonsils bilaterally. No
focal mass. Hypertrophy of the lingual tonsil bilaterally. Airway
intact. Epiglottis and larynx normal.

Salivary glands: No inflammation, mass, or stone.

Thyroid: Total thyroidectomy. No residual. Postsurgical clips in the
thyroid bed bilaterally thyroid tissue identified.

Lymph nodes: No enlarged lymph nodes. 8 mm level 2 lymph nodes
bilaterally.

Vascular: Negative

Limited intracranial: Negative

Visualized orbits: Scleral band  left orbit.  Right orbit normal.

Mastoids and visualized paranasal sinuses: Negative

Skeleton: Negative

Upper chest: Negative

Other: Cystic mass in the anterior neck in the midline. This is
surrounded by the strap muscles. This has homogeneous water density
and extends deep to the hyoid bone. The cyst measures approximately
2.3 x 2.8 x 4.2 cm and is consistent with thyroglossal duct cyst.
IMPRESSION: Thyroglossal duct cyst 2.3 x 2.8 x 4.2 cm.

Tonsillar hypertrophy bilaterally suggestive of chronic pharyngitis.

## 2020-06-18 ENCOUNTER — Ambulatory Visit: Payer: Federal, State, Local not specified - PPO | Attending: Internal Medicine

## 2020-06-18 DIAGNOSIS — Z23 Encounter for immunization: Secondary | ICD-10-CM

## 2020-06-18 NOTE — Progress Notes (Signed)
   Covid-19 Vaccination Clinic  Name:  Courtney [name]    MRN: [id number] DOB: 1960-10-15  06/18/2020  [name] was observed post Covid-19 immunization for 15 minutes without incident. She was provided with Vaccine Information Sheet and instruction to access the V-Safe system.   [name] was instructed to call 911 with any severe reactions post vaccine: Marland Kitchen Difficulty breathing  . Swelling of face and throat  . A fast heartbeat  . A bad rash all over body  . Dizziness and weakness   Immunizations Administered    Name Date Dose VIS Date Route   Pfizer COVID-19 Vaccine 06/18/2020  2:59 PM 0.3 mL 05/25/2020 Intramuscular   Manufacturer: [employer], [employer]   Lot: [id number]   NDC: [address]-0

## 2020-07-25 ENCOUNTER — Other Ambulatory Visit: Payer: Self-pay | Admitting: Internal Medicine

## 2020-07-25 DIAGNOSIS — Z1231 Encounter for screening mammogram for malignant neoplasm of breast: Secondary | ICD-10-CM

## 2020-09-01 ENCOUNTER — Other Ambulatory Visit: Payer: Self-pay

## 2020-09-01 ENCOUNTER — Ambulatory Visit
Admission: RE | Admit: 2020-09-01 | Discharge: 2020-09-01 | Disposition: A | Discharge status: Home / Self Care | Payer: Federal, State, Local not specified - PPO | Source: Ambulatory Visit | Attending: Internal Medicine | Admitting: Internal Medicine

## 2020-09-01 DIAGNOSIS — Z1231 Encounter for screening mammogram for malignant neoplasm of breast: Secondary | ICD-10-CM

## 2020-09-08 DIAGNOSIS — H2511 Age-related nuclear cataract, right eye: Secondary | ICD-10-CM | POA: Diagnosis not present

## 2020-09-08 DIAGNOSIS — H04123 Dry eye syndrome of bilateral lacrimal glands: Secondary | ICD-10-CM | POA: Diagnosis not present

## 2020-09-20 DIAGNOSIS — S0502XA Injury of conjunctiva and corneal abrasion without foreign body, left eye, initial encounter: Secondary | ICD-10-CM | POA: Diagnosis not present

## 2020-09-23 DIAGNOSIS — S0502XD Injury of conjunctiva and corneal abrasion without foreign body, left eye, subsequent encounter: Secondary | ICD-10-CM | POA: Diagnosis not present

## 2020-12-09 DIAGNOSIS — I10 Essential (primary) hypertension: Secondary | ICD-10-CM | POA: Diagnosis not present

## 2020-12-09 DIAGNOSIS — E039 Hypothyroidism, unspecified: Secondary | ICD-10-CM | POA: Diagnosis not present

## 2020-12-16 DIAGNOSIS — M25562 Pain in left knee: Secondary | ICD-10-CM | POA: Diagnosis not present

## 2020-12-16 DIAGNOSIS — Z0000 Encounter for general adult medical examination without abnormal findings: Secondary | ICD-10-CM | POA: Diagnosis not present

## 2020-12-16 DIAGNOSIS — R82998 Other abnormal findings in urine: Secondary | ICD-10-CM | POA: Diagnosis not present

## 2020-12-16 DIAGNOSIS — I10 Essential (primary) hypertension: Secondary | ICD-10-CM | POA: Diagnosis not present

## 2021-08-23 ENCOUNTER — Other Ambulatory Visit: Payer: Self-pay | Admitting: Internal Medicine

## 2021-08-23 DIAGNOSIS — Z1231 Encounter for screening mammogram for malignant neoplasm of breast: Secondary | ICD-10-CM

## 2021-09-13 ENCOUNTER — Ambulatory Visit: Payer: Federal, State, Local not specified - PPO

## 2021-09-27 ENCOUNTER — Other Ambulatory Visit: Payer: Self-pay

## 2021-09-27 ENCOUNTER — Ambulatory Visit
Admission: RE | Admit: 2021-09-27 | Discharge: 2021-09-27 | Disposition: A | Discharge status: Home / Self Care | Payer: Federal, State, Local not specified - PPO | Source: Ambulatory Visit | Attending: Internal Medicine | Admitting: Internal Medicine

## 2021-09-27 ENCOUNTER — Ambulatory Visit: Payer: Federal, State, Local not specified - PPO

## 2021-09-27 DIAGNOSIS — Z1231 Encounter for screening mammogram for malignant neoplasm of breast: Secondary | ICD-10-CM

## 2021-09-28 DIAGNOSIS — H5213 Myopia, bilateral: Secondary | ICD-10-CM | POA: Diagnosis not present

## 2021-09-28 DIAGNOSIS — H2511 Age-related nuclear cataract, right eye: Secondary | ICD-10-CM | POA: Diagnosis not present

## 2021-10-30 ENCOUNTER — Encounter (INDEPENDENT_AMBULATORY_CARE_PROVIDER_SITE_OTHER): Payer: Self-pay | Admitting: Ophthalmology

## 2021-10-30 ENCOUNTER — Other Ambulatory Visit: Payer: Self-pay

## 2021-10-30 ENCOUNTER — Ambulatory Visit (INDEPENDENT_AMBULATORY_CARE_PROVIDER_SITE_OTHER): Payer: Federal, State, Local not specified - PPO | Admitting: Ophthalmology

## 2021-10-30 DIAGNOSIS — H2511 Age-related nuclear cataract, right eye: Secondary | ICD-10-CM

## 2021-10-30 DIAGNOSIS — H43813 Vitreous degeneration, bilateral: Secondary | ICD-10-CM | POA: Diagnosis not present

## 2021-10-30 DIAGNOSIS — Z8669 Personal history of other diseases of the nervous system and sense organs: Secondary | ICD-10-CM | POA: Insufficient documentation

## 2021-10-30 DIAGNOSIS — Z961 Presence of intraocular lens: Secondary | ICD-10-CM | POA: Insufficient documentation

## 2021-10-30 NOTE — Assessment & Plan Note (Signed)
OS doing well status post repair retinal detachment 2006 via scleral buckle. ? ? ?

## 2021-10-30 NOTE — Assessment & Plan Note (Signed)
Doing well 

## 2021-10-30 NOTE — Assessment & Plan Note (Addendum)
Mild, follow-up with Dr. Jola Schmidt ? ?I explained to the patient that her lifetime risk of retinal detachment in her fellow eye, this right 1, does not increase once cataract surgery is done it simply compresses into the 3 years thereafter. ? ?No findings today of retinal holes tears or high risk areas in the right eye.  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 ? ? ?

## 2021-10-30 NOTE — Progress Notes (Signed)
? ? ?10/30/2021 ? ?  ? ?CHIEF COMPLAINT ?Patient presents for  ?Chief Complaint  ?Patient presents with  ? Retina Follow Up  ? ? ? ? ?HISTORY OF PRESENT ILLNESS: ?Courtney [name] is a [age] y.o. female who presents to the clinic today for:  ? ?HPI   ? ? Retina Follow Up   ? ?      ? Onset: 2 years ago  ? Course: gradually improving  ? ?  ?  ? ? Comments   ?2 YR FU OCT OU. ?Pt states she feels like her vision is better than it used to be. ?Pt states she does not know her regular eye doctor's name. ? ?  ?  ?Last edited by Nelva NayKronstein, Anna N on 10/30/2021  1:05 PM.  ?  ? ? ?Referring physician: ?Sinda DuBowen, [place], MD ?[address] ?[place] [id number] ? ?HISTORICAL INFORMATION:  ? ?Selected notes from the MEDICAL RECORD NUMBER ?  ?   ? ?CURRENT MEDICATIONS: ?No current outpatient medications on file. (Ophthalmic Drugs)  ? ?No current facility-administered medications for this visit. (Ophthalmic Drugs)  ? ?Current Outpatient Medications (Other)  ?Medication Sig  ? acetaminophen (TYLENOL) 650 MG CR tablet Take 650 mg by mouth every 8 (eight) hours as needed for pain.  ? amLODipine (NORVASC) 2.5 MG tablet Take 2.5 mg by mouth daily.  ? Ascorbic Acid (VITAMIN C) 1000 MG tablet Take 1,000 mg by mouth daily.  ? aspirin EC 81 MG tablet Take 81 mg by mouth daily.  ? ibuprofen (ADVIL,MOTRIN) 600 MG tablet Take 1 tablet (600 mg total) by mouth every 6 (six) hours as needed.  ? levothyroxine (SYNTHROID, LEVOTHROID) 125 MCG tablet Take 125 mcg by mouth daily before breakfast. 6 days a week  ? loratadine (CLARITIN) 10 MG tablet Take 10 mg by mouth daily.  ? losartan-hydrochlorothiazide (HYZAAR) 50-12.5 MG tablet Take 1 tablet by mouth daily.  ? magnesium chloride (SLOW-MAG) 64 MG TBEC SR tablet Take by mouth.  ? meloxicam (MOBIC) 7.5 MG tablet Take 1 tablet (7.5 mg total) by mouth 2 (two) times daily as needed for pain.  ? TURMERIC PO Take by mouth.  ? valACYclovir (VALTREX) 500 MG tablet Take 500 mg by mouth daily.  ? vitamin E 100 UNIT  capsule Take by mouth daily.  ? ?No current facility-administered medications for this visit. (Other)  ? ? ? ? ?REVIEW OF SYSTEMS: ? ? ? ?ALLERGIES ?No Known Allergies ? ?PAST MEDICAL HISTORY ?Past Medical History:  ?Diagnosis Date  ? Allergy   ? Depression   ? Herpes   ? Hx of blood clots 2000  ? left leg x1   ? Hypertension   ? Thyroid disease   ? ?Past Surgical History:  ?Procedure Laterality Date  ? ABDOMINAL HYSTERECTOMY    ? CESAREAN SECTION    ? RETINAL DETACHMENT SURGERY    ? THYROIDECTOMY    ? WISDOM TOOTH EXTRACTION    ? ? ?FAMILY HISTORY ?Family History  ?Problem Relation Age of Onset  ? Diabetes Mother   ? Heart disease Mother   ? Heart disease Father   ? Kidney disease Father   ? Prostate cancer Paternal Uncle   ? Diabetes Maternal Grandfather   ? Stroke Maternal Grandfather   ? Ovarian cancer Paternal Grandfather   ? Kidney disease Paternal Grandfather   ? Colon cancer Neg Hx   ? Breast cancer Neg Hx   ? ? ?SOCIAL HISTORY ?Social History  ? ?Tobacco Use  ?  Smoking status: Never  ? Smokeless tobacco: Never  ?Substance Use Topics  ? Alcohol use: No  ? Drug use: No  ? ?  ? ?  ? ?OPHTHALMIC EXAM: ? ?Base Eye Exam   ? ? Visual Acuity (ETDRS)   ? ?   Right Left  ? Dist cc 20/25 -2 20/60 -2  ? ? Correction: Glasses  ? ?  ?  ? ? Tonometry (Tonopen, 1:10 PM)   ? ?   Right Left  ? Pressure 8 6  ? ?  ?  ? ? Pupils   ? ?   Pupils Dark Light APD  ? Right PERRL 4 3 None  ? Left PERRL 4 3 None  ? ?  ?  ? ? Visual Fields (Counting fingers)   ? ?   Left Right  ?  Full Full  ? ?  ?  ? ? Extraocular Movement   ? ?   Right Left  ?  Full Full  ? ?  ?  ? ? Neuro/Psych   ? ? Oriented x3: Yes  ? Mood/Affect: Normal  ? ?  ?  ? ? Dilation   ? ? Both eyes: 1.0% Mydriacyl, 2.5% Phenylephrine @ 1:10 PM  ? ?  ?  ? ?  ? ?Slit Lamp and Fundus Exam   ? ? External Exam   ? ?   Right Left  ? External Normal Normal  ? ?  ?  ? ? Slit Lamp Exam   ? ?   Right Left  ? Lids/Lashes Normal Normal  ? Conjunctiva/Sclera White and quiet White and  quiet  ? Cornea Clear Clear  ? Anterior Chamber Deep and quiet Deep and quiet  ? Iris Round and reactive Round and reactive  ? Lens 1+ Nuclear sclerosis Centered posterior chamber intraocular lens  ? Anterior Vitreous Normal Normal  ? ?  ?  ? ? Fundus Exam   ? ?   Right Left  ? Posterior Vitreous Normal Normal  ? Disc Normal Normal  ? C/D Ratio 0.6 0.6  ? Macula Normal Normal  ? Vessels Normal Normal  ? Periphery Normal, no holes or tears, 25 d and 20 d exam good scleral buckle 360  ? ?  ?  ? ?  ? ? ?IMAGING AND PROCEDURES  ?Imaging and Procedures for 10/30/21 ? ?OCT, Retina - OU - Both Eyes   ? ?   ?Right Eye ?Quality was good. Scan locations included subfoveal. Central Foveal Thickness: 196. Progression has been stable. Findings include abnormal foveal contour.  ? ?Left Eye ?Central Foveal Thickness: 208. Progression has been stable. Findings include abnormal foveal contour.  ? ?Notes ?Bilateral thin fovea likely genetic in origin due to high myopia.  Not pathologic with no pathological distortions in either eye ? ?  ? ?Color Fundus Photography Optos - OU - Both Eyes   ? ?   ?Right Eye ?Progression has no prior data. Disc findings include increased cup to disc ratio.  ? ?Left Eye ?Progression has no prior data. Disc findings include increased cup to disc ratio.  ? ?Notes ?Incidental PVD OD, retina attached clear media ? ?OS ?Now clear media with Good scleral buckle 360 ? ?  ? ? ?  ?  ? ?  ?ASSESSMENT/PLAN: ? ?Nuclear sclerotic cataract of right eye ?Mild, follow-up with Dr. Sinda Du ? ?I explained to the patient that her lifetime risk of retinal detachment in her fellow eye, this right 1, does not  increase once cataract surgery is done it simply compresses into the 3 years thereafter. ? ?No findings today of retinal holes tears or high risk areas in the right eye.  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 ? ? ? ?Pseudophakia, left eye ?Doing well ? ?History of retinal detachment ?OS doing well status post repair  retinal detachment 2006 via scleral buckle. ? ?  ? ?  ICD-10-CM   ?1. History of retinal detachment  Z86.69 Color Fundus Photography Optos - OU - Both Eyes  ?  ?2. Posterior vitreous detachment of both eyes  H43.813 OCT, Retina - OU - Both Eyes  ?  Color Fundus Photography Optos - OU - Both Eyes  ?  ?3. Nuclear sclerotic cataract of right eye  H25.11   ?  ?4. Pseudophakia, left eye  Z96.1   ?  ? ? ?1.  OU, stable, no new retinal findings, history of retinal detachment left eye looks ? ?2.  OD with early cataract, follow-up Dr. Sinda Du ? ?3. ? ?Ophthalmic Meds Ordered this visit:  ?No orders of the defined types were placed in this encounter. ? ? ?  ? ?Return in about 1 year (around 10/31/2022) for DILATE OU, COLOR FP, OCT. ? ?There are no Patient Instructions on file for this visit. ? ? ?Explained the diagnoses, plan, and follow up with the patient and they expressed understanding.  Patient expressed understanding of the importance of proper follow up care.  ? ?Alford Highland. Marine Lezotte M.D. ?Diseases & Surgery of the Retina and Vitreous ?[facility] ?10/30/21 ? ? ? ? ?Abbreviations: ?M myopia (nearsighted); A astigmatism; H hyperopia (farsighted); P presbyopia; Mrx spectacle prescription;  CTL contact lenses; OD right eye; OS left eye; OU both eyes  XT exotropia; ET esotropia; PEK punctate epithelial keratitis; PEE punctate epithelial erosions; DES dry eye syndrome; MGD meibomian gland dysfunction; ATs artificial tears; PFAT's preservative free artificial tears; NSC nuclear sclerotic cataract; PSC posterior subcapsular cataract; ERM epi-retinal membrane; PVD posterior vitreous detachment; RD retinal detachment; DM diabetes mellitus; DR diabetic retinopathy; NPDR non-proliferative diabetic retinopathy; PDR proliferative diabetic retinopathy; CSME clinically significant macular edema; DME diabetic macular edema; dbh dot blot hemorrhages; CWS cotton wool spot; POAG primary open angle glaucoma; C/D  cup-to-disc ratio; HVF humphrey visual field; GVF goldmann visual field; OCT optical coherence tomography; IOP intraocular pressure; BRVO Branch retinal vein occlusion; CRVO central retinal vein occlusion; CRA

## 2021-11-02 DIAGNOSIS — M170 Bilateral primary osteoarthritis of knee: Secondary | ICD-10-CM | POA: Diagnosis not present

## 2021-11-02 DIAGNOSIS — M1712 Unilateral primary osteoarthritis, left knee: Secondary | ICD-10-CM | POA: Diagnosis not present

## 2021-11-02 DIAGNOSIS — M1711 Unilateral primary osteoarthritis, right knee: Secondary | ICD-10-CM | POA: Diagnosis not present

## 2021-11-08 DIAGNOSIS — K080 Exfoliation of teeth due to systemic causes: Secondary | ICD-10-CM | POA: Diagnosis not present

## 2022-02-12 DIAGNOSIS — E039 Hypothyroidism, unspecified: Secondary | ICD-10-CM | POA: Diagnosis not present

## 2022-02-12 DIAGNOSIS — I10 Essential (primary) hypertension: Secondary | ICD-10-CM | POA: Diagnosis not present

## 2022-02-19 DIAGNOSIS — R82998 Other abnormal findings in urine: Secondary | ICD-10-CM | POA: Diagnosis not present

## 2022-02-19 DIAGNOSIS — Z0000 Encounter for general adult medical examination without abnormal findings: Secondary | ICD-10-CM | POA: Diagnosis not present

## 2022-02-19 DIAGNOSIS — Z1331 Encounter for screening for depression: Secondary | ICD-10-CM | POA: Diagnosis not present

## 2022-02-19 DIAGNOSIS — I10 Essential (primary) hypertension: Secondary | ICD-10-CM | POA: Diagnosis not present

## 2022-02-19 DIAGNOSIS — E039 Hypothyroidism, unspecified: Secondary | ICD-10-CM | POA: Diagnosis not present

## 2022-03-07 DIAGNOSIS — M654 Radial styloid tenosynovitis [de Quervain]: Secondary | ICD-10-CM | POA: Diagnosis not present

## 2022-03-19 DIAGNOSIS — M25562 Pain in left knee: Secondary | ICD-10-CM | POA: Diagnosis not present

## 2022-04-05 DIAGNOSIS — M654 Radial styloid tenosynovitis [de Quervain]: Secondary | ICD-10-CM | POA: Diagnosis not present

## 2022-05-07 DIAGNOSIS — M25562 Pain in left knee: Secondary | ICD-10-CM | POA: Diagnosis not present

## 2022-05-11 DIAGNOSIS — M25462 Effusion, left knee: Secondary | ICD-10-CM | POA: Diagnosis not present

## 2022-05-11 DIAGNOSIS — M1711 Unilateral primary osteoarthritis, right knee: Secondary | ICD-10-CM | POA: Diagnosis not present

## 2022-05-26 DIAGNOSIS — M25562 Pain in left knee: Secondary | ICD-10-CM | POA: Diagnosis not present

## 2022-05-30 DIAGNOSIS — M25562 Pain in left knee: Secondary | ICD-10-CM | POA: Diagnosis not present

## 2022-05-30 DIAGNOSIS — M1712 Unilateral primary osteoarthritis, left knee: Secondary | ICD-10-CM | POA: Diagnosis not present

## 2022-05-30 DIAGNOSIS — M25462 Effusion, left knee: Secondary | ICD-10-CM | POA: Diagnosis not present

## 2022-06-25 DIAGNOSIS — I10 Essential (primary) hypertension: Secondary | ICD-10-CM | POA: Diagnosis not present

## 2022-07-17 NOTE — H&P (Signed)
TOTAL KNEE ADMISSION H&P  Patient is being admitted for left total knee arthroplasty.  Subjective:  Chief Complaint: Left knee pain.  HPI: Courtney [name], 61 y.o. female has a history of pain and functional disability in the left knee due to arthritis and has failed non-surgical conservative treatments for greater than 12 weeks to include NSAID's and/or analgesics, corticosteriod injections, and activity modification. Onset of symptoms was gradual, starting several years ago with gradually worsening course since that time. The patient noted no past surgery on the left knee.  Patient currently rates pain in the left knee at 7 out of 10 with activity. Patient has night pain, worsening of pain with activity and weight bearing, and pain that interferes with activities of daily living. Patient has evidence of  significant cartilage loss throughout the lateral compartment with a significant lateral meniscal tear with displacement. There is even edema in the proximal lateral tibia. The medial side looks normal. Patellofemoral has some mild degeneration  by imaging studies. There is no active infection.  Patient Active Problem List   Diagnosis Date Noted   Posterior vitreous detachment of both eyes 10/30/2021   History of retinal detachment 10/30/2021   Nuclear sclerotic cataract of right eye 10/30/2021   Pseudophakia, left eye 10/30/2021   Unilateral primary osteoarthritis, left knee 04/11/2017   Effusion of right knee 03/29/2017    Past Medical History:  Diagnosis Date   Allergy    Depression    Herpes    Hx of blood clots 2000   left leg x1    Hypertension    Thyroid disease     Past Surgical History:  Procedure Laterality Date   ABDOMINAL HYSTERECTOMY     CESAREAN SECTION     RETINAL DETACHMENT SURGERY     THYROIDECTOMY     WISDOM TOOTH EXTRACTION      Prior to Admission medications   Medication Sig Start Date End Date Taking? Authorizing Provider  acetaminophen (TYLENOL)  650 MG CR tablet Take 650 mg by mouth every 8 (eight) hours as needed for pain.    [provider]  amLODipine (NORVASC) 2.5 MG tablet Take 2.5 mg by mouth daily.    [provider]  Ascorbic Acid (VITAMIN C) 1000 MG tablet Take 1,000 mg by mouth daily.    [provider]  aspirin EC 81 MG tablet Take 81 mg by mouth daily.    [provider]  ibuprofen (ADVIL,MOTRIN) 600 MG tablet Take 1 tablet (600 mg total) by mouth every 6 (six) hours as needed. 03/30/15   Arby Barrette, MD  levothyroxine (SYNTHROID, LEVOTHROID) 125 MCG tablet Take 125 mcg by mouth daily before breakfast. 6 days a week    [provider]  loratadine (CLARITIN) 10 MG tablet Take 10 mg by mouth daily.    [provider]  losartan-hydrochlorothiazide (HYZAAR) 50-12.5 MG tablet Take 1 tablet by mouth daily.    [provider]  magnesium chloride (SLOW-MAG) 64 MG TBEC SR tablet Take by mouth.    [provider]  meloxicam (MOBIC) 7.5 MG tablet Take 1 tablet (7.5 mg total) by mouth 2 (two) times daily as needed for pain. 02/04/17   Tarry Kos, MD  TURMERIC PO Take by mouth.    [provider]  valACYclovir (VALTREX) 500 MG tablet Take 500 mg by mouth daily.    [provider]  vitamin E 100 UNIT capsule Take by mouth daily.    [provider]  No Known Allergies  Social History   Socioeconomic History   Marital status: Single    Spouse name: Not on file   Number of children: Not on file   Years of education: Not on file   Highest education level: Not on file  Occupational History   Not on file  Tobacco Use   Smoking status: Never   Smokeless tobacco: Never  Substance and Sexual Activity   Alcohol use: No   Drug use: No   Sexual activity: Yes  Other Topics Concern   Not on file  Social History Narrative   Not on file   Social Determinants of Health   Financial Resource Strain: Not on file  Food Insecurity: Not  on file  Transportation Needs: Not on file  Physical Activity: Not on file  Stress: Not on file  Social Connections: Not on file  Intimate Partner Violence: Not on file    Tobacco Use: Low Risk  (10/30/2021)   Patient History    Smoking Tobacco Use: Never    Smokeless Tobacco Use: Never    Passive Exposure: Not on file   Social History   Substance and Sexual Activity  Alcohol Use No    Family History  Problem Relation Age of Onset   Diabetes Mother    Heart disease Mother    Heart disease Father    Kidney disease Father    Prostate cancer Paternal Uncle    Diabetes Maternal Grandfather    Stroke Maternal Grandfather    Ovarian cancer Paternal Grandfather    Kidney disease Paternal Grandfather    Colon cancer Neg Hx    Breast cancer Neg Hx     Review of Systems  Constitutional:  Negative for chills and fever.  HENT: Negative.    Eyes: Negative.   Respiratory:  Negative for cough and shortness of breath.   Cardiovascular:  Negative for chest pain and palpitations.  Gastrointestinal:  Negative for abdominal pain, constipation, diarrhea, nausea and vomiting.  Genitourinary:  Negative for dysuria, frequency and urgency.  Musculoskeletal:  Positive for joint pain.  Skin:  Negative for rash.   Objective:  Physical Exam: Well nourished and well developed.  General: Alert and oriented x3, cooperative and pleasant, no acute distress.  Head: normocephalic, atraumatic, neck supple.  Eyes: EOMI. Abdomen: non-tender to palpation and soft, normoactive bowel sounds. Musculoskeletal:  Left Knee Exam:  Large effusion present.  The range of motion is: 0 to 115 degrees.  No crepitus on range of motion of the knee.  No medial joint line tenderness.  Positive lateral joint line tenderness.  The knee is stable.  Calves soft and nontender. Motor function intact in LE. Strength 5/5 LE bilaterally. Neuro: Distal pulses 2+. Sensation to light touch intact in LE.  Vital signs in  last 24 hours: BP: ()/()  Arterial Line BP: ()/()   Imaging Review Plain radiographs demonstrate moderate degenerative joint disease of the left knee. The overall alignment is neutral. The bone quality appears to be adequate for age and reported activity level.  Assessment/Plan:  End stage arthritis, left knee   The patient history, physical examination, clinical judgment of the provider and imaging studies are consistent with end stage degenerative joint disease of the left knee and total knee arthroplasty is deemed medically necessary. The treatment options including medical management, injection therapy arthroscopy and arthroplasty were discussed at length. The risks and benefits of total knee arthroplasty were presented and reviewed. The risks due to aseptic loosening,  infection, stiffness, patella tracking problems, thromboembolic complications and other imponderables were discussed. The patient acknowledged the explanation, agreed to proceed with the plan and consent was signed. Patient is being admitted for inpatient treatment for surgery, pain control, PT, OT, prophylactic antibiotics, VTE prophylaxis, progressive ambulation and ADLs and discharge planning. The patient is planning to be discharged  home .  Patient's anticipated LOS is less than 2 midnights, meeting these requirements: - Younger than 62 - Lives within 1 hour of care - Has a competent adult at home to recover with post-op - NO history of  - Chronic pain requiring opiods  - Diabetes  - Coronary Artery Disease  - Heart failure  - Heart attack  - Stroke  - Cardiac arrhythmia  - Respiratory Failure/COPD  - Renal failure  - Anemia  - Advanced Liver disease  Therapy Plans: EO Disposition: Home with Friend Planned DVT Prophylaxis: Xarelto 10 mg DME Needed: RW PCP: Jarome Matin, MD (clearance received) TXA: Topical (hx of DVT) Allergies: NKDA Anesthesia Concerns: None BMI: 31.2 Last HgbA1c: not  diabetic  Pharmacy: [facility]  Other: -hx of DVT 25 years ago  - Patient was instructed on what medications to stop prior to surgery. - Follow-up visit in 2 weeks with Dr. Lequita Halt - Begin physical therapy following surgery - Pre-operative lab work as pre-surgical testing - Prescriptions will be provided in hospital at time of discharge  R. Arcola Jansky, PA-C Orthopedic Surgery [facility] [place]

## 2022-07-31 NOTE — Patient Instructions (Addendum)
SURGICAL WAITING ROOM VISITATION  Patients having surgery or a procedure may have no more than 2 support people in the waiting area - these visitors may rotate.    Children under the age of 11 must have an adult with them who is not the patient.  Due to an increase in RSV and influenza rates and associated hospitalizations, children ages 93 and under may not visit patients in [facility] hospitals.  If the patient needs to stay at the hospital during part of their recovery, the visitor guidelines for inpatient rooms apply. Pre-op nurse will coordinate an appropriate time for 1 support person to accompany patient in pre-op.  This support person may not rotate.    Please refer to the [facility] website for the visitor guidelines for Inpatients (after your surgery is over and you are in a regular room).       Your procedure is scheduled on:  08/13/2022    Report to [facility] Main Entrance    Report to admitting at  0700 AM   Call this number if you have problems the morning of surgery [phone]   Do not eat food :After Midnight.   After Midnight you may have the following liquids until _ 0615_____ AM DAY OF SURGERY  Water Non-Citrus Juices (without pulp, NO RED-Apple, White grape, White cranberry) Black Coffee (NO MILK/CREAM OR CREAMERS, sugar ok)  Clear Tea (NO MILK/CREAM OR CREAMERS, sugar ok) regular and decaf                             Plain Jell-O (NO RED)                                           Fruit ices (not with fruit pulp, NO RED)                                     Popsicles (NO RED)                                                               Sports drinks like Gatorade (NO RED)                      The day of surgery:  Drink ONE (1) Pre-Surgery Clear Ensure or G2 at   9528UX ( have completed by )  the morning of surgery. Drink in one sitting. Do not sip.  This drink was given to you during your hospital  pre-op appointment visit. Nothing else to  drink after completing the  Pre-Surgery Clear Ensure or G2.          If you have questions, please contact your surgeon's office.       Oral Hygiene is also important to reduce your risk of infection.                                    Remember - BRUSH YOUR TEETH THE MORNING OF  SURGERY WITH YOUR REGULAR TOOTHPASTE  DENTURES WILL BE REMOVED PRIOR TO SURGERY PLEASE DO NOT APPLY "Poly grip" OR ADHESIVES!!!   Do NOT smoke after Midnight   Take these medicines the morning of surgery with A SIP OF WATER:  amlodipione , synthroid, claritin   DO NOT TAKE ANY ORAL DIABETIC MEDICATIONS DAY OF YOUR SURGERY  Bring CPAP mask and tubing day of surgery.                              You may not have any metal on your body including hair pins, jewelry, and body piercing             Do not wear make-up, lotions, powders, perfumes/cologne, or deodorant  Do not wear nail polish including gel and S&S, artificial/acrylic nails, or any other type of covering on natural nails including finger and toenails. If you have artificial nails, gel coating, etc. that needs to be removed by a nail salon please have this removed prior to surgery or surgery may need to be canceled/ delayed if the surgeon/ anesthesia feels like they are unable to be safely monitored.   Do not shave  48 hours prior to surgery.               Men may shave face and neck.   Do not bring valuables to the hospital. [place].   Contacts, glasses, dentures or bridgework may not be worn into surgery.   Bring small overnight bag day of surgery.   DO NOT [place]. PHARMACY WILL DISPENSE MEDICATIONS LISTED ON YOUR MEDICATION LIST TO YOU DURING YOUR ADMISSION [place]!    Patients discharged on the day of surgery will not be allowed to drive home.  Someone NEEDS to stay with you for the first 24 hours after anesthesia.   Special Instructions: Bring  a copy of your healthcare power of attorney and living will documents the day of surgery if you haven't scanned them before.              Please read over the following fact sheets you were given: IF [place] [phone]   If you received a COVID test during your pre-op visit  it is requested that you wear a mask when out in public, stay away from anyone that may not be feeling well and notify your surgeon if you develop symptoms. If you test positive for Covid or have been in contact with anyone that has tested positive in the last 10 days please notify you surgeon.    [place] - Preparing for Surgery Before surgery, you can play an important role.  Because skin is not sterile, your skin needs to be as free of germs as possible.  You can reduce the number of germs on your skin by washing with CHG (chlorahexidine gluconate) soap before surgery.  CHG is an antiseptic cleaner which kills germs and bonds with the skin to continue killing germs even after washing. Please DO NOT use if you have an allergy to CHG or antibacterial soaps.  If your skin becomes reddened/irritated stop using the CHG and inform your nurse when you arrive at Short Stay. Do not shave (including legs and underarms) for at least 48 hours prior  to the first CHG shower.  You may shave your face/neck. Please follow these instructions carefully:  1.  Shower with CHG Soap the night before surgery and the  morning of Surgery.  2.  If you choose to wash your hair, wash your hair first as usual with your  normal  shampoo.  3.  After you shampoo, rinse your hair and body thoroughly to remove the  shampoo.                           4.  Use CHG as you would any other liquid soap.  You can apply chg directly  to the skin and wash                       Gently with a scrungie or clean washcloth.  5.  Apply the CHG Soap to your body ONLY FROM THE NECK DOWN.   Do not use on face/ open                            Wound or open sores. Avoid contact with eyes, ears mouth and genitals (private parts).                       Wash face,  Genitals (private parts) with your normal soap.             6.  Wash thoroughly, paying special attention to the area where your surgery  will be performed.  7.  Thoroughly rinse your body with warm water from the neck down.  8.  DO NOT shower/wash with your normal soap after using and rinsing off  the CHG Soap.                9.  Pat yourself dry with a clean towel.            10.  Wear clean pajamas.            11.  Place clean sheets on your bed the night of your first shower and do not  sleep with pets. Day of Surgery : Do not apply any lotions/deodorants the morning of surgery.  Please wear clean clothes to the hospital/surgery center.  FAILURE TO FOLLOW THESE INSTRUCTIONS MAY RESULT IN THE CANCELLATION OF YOUR SURGERY PATIENT SIGNATURE_________________________________  NURSE SIGNATURE__________________________________  ________________________________________________________________________

## 2022-07-31 NOTE — Progress Notes (Addendum)
Anesthesia Review:  PCP: Jarome Matin Clearance dated 07/03/22 on chart LOV 06/25/22 on chart  Cardiologist : Chest x-ray : EKG : Echo : 2019  Stress test: 2019  Cardiac Cath :  Activity level:  Sleep Study/ CPAP : Fasting Blood Sugar :      / Checks Blood Sugar -- times a day:   Blood Thinner/ Instructions /Last Dose: ASA / Instructions/ Last Dose :

## 2022-08-01 ENCOUNTER — Other Ambulatory Visit: Payer: Self-pay

## 2022-08-01 ENCOUNTER — Encounter (HOSPITAL_COMMUNITY)
Admission: RE | Admit: 2022-08-01 | Discharge: 2022-08-01 | Disposition: A | Discharge status: Home / Self Care | Payer: Federal, State, Local not specified - PPO | Source: Ambulatory Visit | Attending: Orthopedic Surgery | Admitting: Orthopedic Surgery

## 2022-08-01 ENCOUNTER — Encounter (HOSPITAL_COMMUNITY): Payer: Self-pay

## 2022-08-01 VITALS — BP 138/92 | HR 72 | Temp 98.7°F | Resp 16 | Ht 68.0 in | Wt 197.0 lb

## 2022-08-01 DIAGNOSIS — Z01818 Encounter for other preprocedural examination: Secondary | ICD-10-CM | POA: Diagnosis not present

## 2022-08-01 HISTORY — DX: Unspecified osteoarthritis, unspecified site: M19.90

## 2022-08-01 HISTORY — DX: Headache, unspecified: R51.9

## 2022-08-01 HISTORY — DX: Family history of other specified conditions: Z84.89

## 2022-08-01 LAB — CBC
HCT: 41.6 % (ref 36.0–46.0)
Hemoglobin: 14 g/dL (ref 12.0–15.0)
MCH: 30.2 pg (ref 26.0–34.0)
MCHC: 33.7 g/dL (ref 30.0–36.0)
MCV: 89.8 fL (ref 80.0–100.0)
Platelets: 209 10*3/uL (ref 150–400)
RBC: 4.63 MIL/uL (ref 3.87–5.11)
RDW: 13 % (ref 11.5–15.5)
WBC: 4.2 10*3/uL (ref 4.0–10.5)
nRBC: 0 % (ref 0.0–0.2)

## 2022-08-01 LAB — SURGICAL PCR SCREEN
MRSA, PCR: NEGATIVE
Staphylococcus aureus: NEGATIVE

## 2022-08-01 LAB — BASIC METABOLIC PANEL
Anion gap: 6 (ref 5–15)
BUN: 20 mg/dL (ref 6–20)
CO2: 23 mmol/L (ref 22–32)
Calcium: 9 mg/dL (ref 8.9–10.3)
Chloride: 110 mmol/L (ref 98–111)
Creatinine, Ser: 0.69 mg/dL (ref 0.44–1.00)
GFR, Estimated: >60 mL/min (ref >60)
Glucose, Bld: 91 mg/dL (ref 70–99)
Potassium: 3.7 mmol/L (ref 3.5–5.1)
Sodium: 139 mmol/L (ref 135–145)

## 2022-08-09 DIAGNOSIS — I10 Essential (primary) hypertension: Secondary | ICD-10-CM | POA: Diagnosis not present

## 2022-08-09 DIAGNOSIS — J00 Acute nasopharyngitis [common cold]: Secondary | ICD-10-CM | POA: Diagnosis not present

## 2022-08-13 ENCOUNTER — Encounter (HOSPITAL_COMMUNITY)
Admission: RE | Disposition: A | Discharge status: Home / Self Care | Payer: Self-pay | Source: Ambulatory Visit | Attending: Orthopedic Surgery

## 2022-08-13 ENCOUNTER — Observation Stay (HOSPITAL_COMMUNITY)
Admission: RE | Admit: 2022-08-13 | Discharge: 2022-08-16 | Disposition: A | Discharge status: Home / Self Care | Payer: Federal, State, Local not specified - PPO | Source: Ambulatory Visit | Attending: Orthopedic Surgery | Admitting: Orthopedic Surgery

## 2022-08-13 ENCOUNTER — Ambulatory Visit (HOSPITAL_COMMUNITY): Payer: Federal, State, Local not specified - PPO | Admitting: Certified Registered Nurse Anesthetist

## 2022-08-13 ENCOUNTER — Other Ambulatory Visit: Payer: Self-pay

## 2022-08-13 ENCOUNTER — Encounter (HOSPITAL_COMMUNITY): Payer: Self-pay | Admitting: Orthopedic Surgery

## 2022-08-13 ENCOUNTER — Ambulatory Visit (HOSPITAL_COMMUNITY): Payer: Federal, State, Local not specified - PPO | Admitting: Physician Assistant

## 2022-08-13 DIAGNOSIS — Z7982 Long term (current) use of aspirin: Secondary | ICD-10-CM | POA: Insufficient documentation

## 2022-08-13 DIAGNOSIS — M1712 Unilateral primary osteoarthritis, left knee: Secondary | ICD-10-CM | POA: Diagnosis not present

## 2022-08-13 DIAGNOSIS — I10 Essential (primary) hypertension: Secondary | ICD-10-CM | POA: Diagnosis not present

## 2022-08-13 DIAGNOSIS — Z79899 Other long term (current) drug therapy: Secondary | ICD-10-CM | POA: Insufficient documentation

## 2022-08-13 DIAGNOSIS — G8918 Other acute postprocedural pain: Secondary | ICD-10-CM | POA: Diagnosis not present

## 2022-08-13 DIAGNOSIS — Z01818 Encounter for other preprocedural examination: Secondary | ICD-10-CM

## 2022-08-13 HISTORY — PX: TOTAL KNEE ARTHROPLASTY: SHX125

## 2022-08-13 SURGERY — ARTHROPLASTY, KNEE, TOTAL
Anesthesia: Spinal | Site: Knee | Laterality: Left

## 2022-08-13 MED ORDER — PHENYLEPHRINE 80 MCG/ML (10ML) SYRINGE FOR IV PUSH (FOR BLOOD PRESSURE SUPPORT)
PREFILLED_SYRINGE | INTRAVENOUS | Status: AC
  Filled 2022-08-13: qty 10

## 2022-08-13 MED ORDER — DEXAMETHASONE SODIUM PHOSPHATE 10 MG/ML IJ SOLN
10.0000 mg | Freq: Once | INTRAMUSCULAR | Status: AC
  Administered 2022-08-14: 10 mg via INTRAVENOUS
  Filled 2022-08-13: qty 1

## 2022-08-13 MED ORDER — ROPIVACAINE HCL 5 MG/ML IJ SOLN
INTRAMUSCULAR | Status: DC | PRN
  Administered 2022-08-13: 30 mL via PERINEURAL

## 2022-08-13 MED ORDER — LACTATED RINGERS IV SOLN: INTRAVENOUS | Status: DC

## 2022-08-13 MED ORDER — MIDAZOLAM HCL 2 MG/2ML IJ SOLN
2.0000 mg | INTRAMUSCULAR | Status: DC
  Administered 2022-08-13: 2 mg via INTRAVENOUS
  Filled 2022-08-13 (×2): qty 2

## 2022-08-13 MED ORDER — AMISULPRIDE (ANTIEMETIC) 5 MG/2ML IV SOLN: 10.0000 mg | Freq: Once | INTRAVENOUS | Status: DC | PRN

## 2022-08-13 MED ORDER — ONDANSETRON HCL 4 MG PO TABS: 4.0000 mg | ORAL_TABLET | Freq: Four times a day (QID) | ORAL | Status: DC | PRN

## 2022-08-13 MED ORDER — DOCUSATE SODIUM 100 MG PO CAPS
100.0000 mg | ORAL_CAPSULE | Freq: Two times a day (BID) | ORAL | Status: DC
  Administered 2022-08-13 – 2022-08-16 (×6): 100 mg via ORAL
  Filled 2022-08-13 (×6): qty 1

## 2022-08-13 MED ORDER — TRANEXAMIC ACID 650 MG PO TABS (ORTHO)
1950.0000 mg | ORAL_TABLET | ORAL | Status: DC
  Filled 2022-08-13: qty 3

## 2022-08-13 MED ORDER — PROPOFOL 500 MG/50ML IV EMUL
INTRAVENOUS | Status: DC | PRN
  Administered 2022-08-13: 75 ug/kg/min via INTRAVENOUS

## 2022-08-13 MED ORDER — ORAL CARE MOUTH RINSE: 15.0000 mL | Freq: Once | OROMUCOSAL | Status: AC

## 2022-08-13 MED ORDER — METOCLOPRAMIDE HCL 5 MG/ML IJ SOLN: 5.0000 mg | Freq: Three times a day (TID) | INTRAMUSCULAR | Status: DC | PRN

## 2022-08-13 MED ORDER — ACETAMINOPHEN 10 MG/ML IV SOLN
1000.0000 mg | Freq: Once | INTRAVENOUS | Status: AC
  Administered 2022-08-13: 1000 mg via INTRAVENOUS
  Filled 2022-08-13: qty 100

## 2022-08-13 MED ORDER — ACETAMINOPHEN 500 MG PO TABS
1000.0000 mg | ORAL_TABLET | Freq: Four times a day (QID) | ORAL | Status: AC
  Administered 2022-08-13 – 2022-08-14 (×4): 1000 mg via ORAL
  Filled 2022-08-13 (×4): qty 2

## 2022-08-13 MED ORDER — DIPHENHYDRAMINE HCL 12.5 MG/5ML PO ELIX: 12.5000 mg | ORAL_SOLUTION | ORAL | Status: DC | PRN

## 2022-08-13 MED ORDER — DEXAMETHASONE SODIUM PHOSPHATE 10 MG/ML IJ SOLN
INTRAMUSCULAR | Status: AC
  Filled 2022-08-13: qty 1

## 2022-08-13 MED ORDER — TRANEXAMIC ACID FOR EPISTAXIS: 2000.0000 mg | Freq: Once | TOPICAL | Status: DC

## 2022-08-13 MED ORDER — AMLODIPINE BESYLATE 5 MG PO TABS
2.5000 mg | ORAL_TABLET | Freq: Every day | ORAL | Status: DC
  Administered 2022-08-13 – 2022-08-16 (×3): 2.5 mg via ORAL
  Filled 2022-08-13 (×4): qty 1

## 2022-08-13 MED ORDER — SODIUM CHLORIDE 0.9 % IR SOLN
Status: DC | PRN
  Administered 2022-08-13 (×2): 1000 mL

## 2022-08-13 MED ORDER — PHENYLEPHRINE 80 MCG/ML (10ML) SYRINGE FOR IV PUSH (FOR BLOOD PRESSURE SUPPORT)
PREFILLED_SYRINGE | INTRAVENOUS | Status: DC | PRN
  Administered 2022-08-13 (×2): 120 ug via INTRAVENOUS

## 2022-08-13 MED ORDER — BUPIVACAINE LIPOSOME 1.3 % IJ SUSP: 20.0000 mL | Freq: Once | INTRAMUSCULAR | Status: DC

## 2022-08-13 MED ORDER — METOCLOPRAMIDE HCL 5 MG PO TABS: 5.0000 mg | ORAL_TABLET | Freq: Three times a day (TID) | ORAL | Status: DC | PRN

## 2022-08-13 MED ORDER — VALACYCLOVIR HCL 500 MG PO TABS
500.0000 mg | ORAL_TABLET | Freq: Every day | ORAL | Status: DC
  Administered 2022-08-14 – 2022-08-16 (×3): 500 mg via ORAL
  Filled 2022-08-13 (×3): qty 1

## 2022-08-13 MED ORDER — ONDANSETRON HCL 4 MG/2ML IJ SOLN
4.0000 mg | Freq: Four times a day (QID) | INTRAMUSCULAR | Status: DC | PRN
  Administered 2022-08-13 – 2022-08-14 (×2): 4 mg via INTRAVENOUS
  Filled 2022-08-13 (×2): qty 2

## 2022-08-13 MED ORDER — ACETAMINOPHEN 325 MG PO TABS: 325.0000 mg | ORAL_TABLET | Freq: Once | ORAL | Status: DC | PRN

## 2022-08-13 MED ORDER — CEFAZOLIN SODIUM-DEXTROSE 2-4 GM/100ML-% IV SOLN
2.0000 g | INTRAVENOUS | Status: AC
  Administered 2022-08-13: 2 g via INTRAVENOUS
  Filled 2022-08-13: qty 100

## 2022-08-13 MED ORDER — ORAL CARE MOUTH RINSE: 15.0000 mL | OROMUCOSAL | Status: DC | PRN

## 2022-08-13 MED ORDER — TRANEXAMIC ACID 1000 MG/10ML IV SOLN
2000.0000 mg | Freq: Once | INTRAVENOUS | Status: DC
  Filled 2022-08-13: qty 20

## 2022-08-13 MED ORDER — LORATADINE 10 MG PO TABS
10.0000 mg | ORAL_TABLET | Freq: Every day | ORAL | Status: DC
  Administered 2022-08-14 – 2022-08-16 (×3): 10 mg via ORAL
  Filled 2022-08-13 (×3): qty 1

## 2022-08-13 MED ORDER — ACETAMINOPHEN 160 MG/5ML PO SOLN: 325.0000 mg | Freq: Once | ORAL | Status: DC | PRN

## 2022-08-13 MED ORDER — MORPHINE SULFATE (PF) 2 MG/ML IV SOLN
1.0000 mg | INTRAVENOUS | Status: DC | PRN
  Administered 2022-08-13: 1 mg via INTRAVENOUS
  Administered 2022-08-14 – 2022-08-15 (×4): 2 mg via INTRAVENOUS
  Filled 2022-08-13 (×5): qty 1

## 2022-08-13 MED ORDER — CEFAZOLIN SODIUM-DEXTROSE 2-4 GM/100ML-% IV SOLN
2.0000 g | Freq: Four times a day (QID) | INTRAVENOUS | Status: AC
  Administered 2022-08-13 (×2): 2 g via INTRAVENOUS
  Filled 2022-08-13 (×2): qty 100

## 2022-08-13 MED ORDER — METHOCARBAMOL 500 MG PO TABS
500.0000 mg | ORAL_TABLET | Freq: Four times a day (QID) | ORAL | Status: DC | PRN
  Administered 2022-08-13 – 2022-08-16 (×11): 500 mg via ORAL
  Filled 2022-08-13 (×11): qty 1

## 2022-08-13 MED ORDER — BUPIVACAINE IN DEXTROSE 0.75-8.25 % IT SOLN
INTRATHECAL | Status: DC | PRN
  Administered 2022-08-13: 1.6 mL via INTRATHECAL

## 2022-08-13 MED ORDER — PHENOL 1.4 % MT LIQD: 1.0000 | OROMUCOSAL | Status: DC | PRN

## 2022-08-13 MED ORDER — BUPIVACAINE LIPOSOME 1.3 % IJ SUSP
INTRAMUSCULAR | Status: AC
  Filled 2022-08-13: qty 20

## 2022-08-13 MED ORDER — SODIUM CHLORIDE (PF) 0.9 % IJ SOLN
INTRAMUSCULAR | Status: AC
  Filled 2022-08-13: qty 10

## 2022-08-13 MED ORDER — DEXAMETHASONE SODIUM PHOSPHATE 10 MG/ML IJ SOLN
8.0000 mg | Freq: Once | INTRAMUSCULAR | Status: AC
  Administered 2022-08-13: 8 mg via INTRAVENOUS

## 2022-08-13 MED ORDER — TRANEXAMIC ACID 1000 MG/10ML IV SOLN
INTRAVENOUS | Status: DC | PRN
  Administered 2022-08-13: 2000 mg via TOPICAL

## 2022-08-13 MED ORDER — OXYCODONE HCL 5 MG PO TABS
5.0000 mg | ORAL_TABLET | ORAL | Status: DC | PRN
  Administered 2022-08-13: 5 mg via ORAL
  Administered 2022-08-13 – 2022-08-16 (×12): 10 mg via ORAL
  Filled 2022-08-13 (×14): qty 2

## 2022-08-13 MED ORDER — ONDANSETRON HCL 4 MG/2ML IJ SOLN
INTRAMUSCULAR | Status: DC | PRN
  Administered 2022-08-13: 4 mg via INTRAVENOUS

## 2022-08-13 MED ORDER — TRANEXAMIC ACID-NACL 1000-0.7 MG/100ML-% IV SOLN
INTRAVENOUS | Status: AC
  Filled 2022-08-13: qty 100

## 2022-08-13 MED ORDER — ACETAMINOPHEN 10 MG/ML IV SOLN: 1000.0000 mg | Freq: Once | INTRAVENOUS | Status: DC | PRN

## 2022-08-13 MED ORDER — GABAPENTIN 300 MG PO CAPS
300.0000 mg | ORAL_CAPSULE | Freq: Three times a day (TID) | ORAL | Status: DC
  Administered 2022-08-13 – 2022-08-16 (×9): 300 mg via ORAL
  Filled 2022-08-13 (×9): qty 1

## 2022-08-13 MED ORDER — SODIUM CHLORIDE (PF) 0.9 % IJ SOLN
INTRAMUSCULAR | Status: DC | PRN
  Administered 2022-08-13: 60 mL

## 2022-08-13 MED ORDER — SODIUM CHLORIDE (PF) 0.9 % IJ SOLN
INTRAMUSCULAR | Status: AC
  Filled 2022-08-13: qty 50

## 2022-08-13 MED ORDER — POVIDONE-IODINE 10 % EX SWAB: 2.0000 | Freq: Once | CUTANEOUS | Status: DC

## 2022-08-13 MED ORDER — FENTANYL CITRATE PF 50 MCG/ML IJ SOSY
100.0000 ug | PREFILLED_SYRINGE | INTRAMUSCULAR | Status: DC
  Administered 2022-08-13: 50 ug via INTRAVENOUS
  Filled 2022-08-13 (×2): qty 2

## 2022-08-13 MED ORDER — TRAMADOL HCL 50 MG PO TABS
50.0000 mg | ORAL_TABLET | Freq: Four times a day (QID) | ORAL | Status: DC | PRN
  Administered 2022-08-13: 50 mg via ORAL
  Administered 2022-08-14 – 2022-08-16 (×6): 100 mg via ORAL
  Filled 2022-08-13 (×2): qty 2
  Filled 2022-08-13: qty 1
  Filled 2022-08-13 (×4): qty 2

## 2022-08-13 MED ORDER — HYDROMORPHONE HCL 1 MG/ML IJ SOLN: 0.2500 mg | INTRAMUSCULAR | Status: DC | PRN

## 2022-08-13 MED ORDER — PROMETHAZINE HCL 25 MG/ML IJ SOLN: 6.2500 mg | INTRAMUSCULAR | Status: DC | PRN

## 2022-08-13 MED ORDER — MEPERIDINE HCL 50 MG/ML IJ SOLN: 6.2500 mg | INTRAMUSCULAR | Status: DC | PRN

## 2022-08-13 MED ORDER — CHLORHEXIDINE GLUCONATE 0.12 % MT SOLN
15.0000 mL | Freq: Once | OROMUCOSAL | Status: AC
  Administered 2022-08-13: 15 mL via OROMUCOSAL

## 2022-08-13 MED ORDER — BUPIVACAINE LIPOSOME 1.3 % IJ SUSP
INTRAMUSCULAR | Status: DC | PRN
  Administered 2022-08-13: 20 mL

## 2022-08-13 MED ORDER — MENTHOL 3 MG MT LOZG: 1.0000 | LOZENGE | OROMUCOSAL | Status: DC | PRN

## 2022-08-13 MED ORDER — RIVAROXABAN 10 MG PO TABS
10.0000 mg | ORAL_TABLET | Freq: Every day | ORAL | Status: DC
  Administered 2022-08-14 – 2022-08-16 (×3): 10 mg via ORAL
  Filled 2022-08-13 (×3): qty 1

## 2022-08-13 MED ORDER — PROPOFOL 10 MG/ML IV BOLUS
INTRAVENOUS | Status: DC | PRN
  Administered 2022-08-13: 30 mg via INTRAVENOUS
  Administered 2022-08-13: 20 mg via INTRAVENOUS

## 2022-08-13 MED ORDER — ONDANSETRON HCL 4 MG/2ML IJ SOLN
INTRAMUSCULAR | Status: AC
  Filled 2022-08-13: qty 2

## 2022-08-13 MED ORDER — SODIUM CHLORIDE 0.9 % IV SOLN: INTRAVENOUS | Status: DC

## 2022-08-13 MED ORDER — PROPOFOL 1000 MG/100ML IV EMUL
INTRAVENOUS | Status: AC
  Filled 2022-08-13: qty 100

## 2022-08-13 MED ORDER — METHOCARBAMOL 500 MG IVPB - SIMPLE MED: 500.0000 mg | Freq: Four times a day (QID) | INTRAVENOUS | Status: DC | PRN

## 2022-08-13 MED ORDER — BISACODYL 10 MG RE SUPP: 10.0000 mg | Freq: Every day | RECTAL | Status: DC | PRN

## 2022-08-13 MED ORDER — POLYETHYLENE GLYCOL 3350 17 G PO PACK
17.0000 g | PACK | Freq: Every day | ORAL | Status: DC | PRN
  Administered 2022-08-13: 17 g via ORAL
  Filled 2022-08-13: qty 1

## 2022-08-13 MED ORDER — FLEET ENEMA 7-19 GM/118ML RE ENEM: 1.0000 | ENEMA | Freq: Once | RECTAL | Status: DC | PRN

## 2022-08-13 MED ORDER — STERILE WATER FOR IRRIGATION IR SOLN
Status: DC | PRN
  Administered 2022-08-13: 2000 mL

## 2022-08-13 MED ORDER — LEVOTHYROXINE SODIUM 125 MCG PO TABS
125.0000 ug | ORAL_TABLET | Freq: Every day | ORAL | Status: DC
  Administered 2022-08-14 – 2022-08-16 (×3): 125 ug via ORAL
  Filled 2022-08-13 (×3): qty 1

## 2022-08-13 SURGICAL SUPPLY — 56 items
ATTUNE MED DOME PAT 38 KNEE (Knees) IMPLANT
ATTUNE PSFEM LTSZ6 NARCEM KNEE (Femur) IMPLANT
ATTUNE PSRP INSR SZ6 8 KNEE (Insert) IMPLANT
BAG COUNTER SPONGE SURGICOUNT (BAG) IMPLANT
BAG SPEC THK2 15X12 ZIP CLS (MISCELLANEOUS) ×1
BAG SPNG CNTER NS LX DISP (BAG)
BAG ZIPLOCK 12X15 (MISCELLANEOUS) ×2 IMPLANT
BASE TIBIAL ROT PLAT SZ 5 KNEE (Knees) IMPLANT
BLADE SAG 18X100X1.27 (BLADE) ×2 IMPLANT
BLADE SAW SGTL 11.0X1.19X90.0M (BLADE) ×2 IMPLANT
BNDG ELASTIC 6X5.8 VLCR STR LF (GAUZE/BANDAGES/DRESSINGS) ×2 IMPLANT
BOWL SMART MIX CTS (DISPOSABLE) ×2 IMPLANT
BSPLAT TIB 5 CMNT ROT PLAT STR (Knees) ×1 IMPLANT
CEMENT HV SMART SET (Cement) ×4 IMPLANT
COVER SURGICAL LIGHT HANDLE (MISCELLANEOUS) ×2 IMPLANT
CUFF TOURN SGL QUICK 34 (TOURNIQUET CUFF) ×1
CUFF TRNQT CYL 34X4.125X (TOURNIQUET CUFF) ×2 IMPLANT
DRAPE INCISE IOBAN 66X45 STRL (DRAPES) ×2 IMPLANT
DRAPE U-SHAPE 47X51 STRL (DRAPES) ×2 IMPLANT
DRSG AQUACEL AG ADV 3.5X10 (GAUZE/BANDAGES/DRESSINGS) ×2 IMPLANT
DURAPREP 26ML APPLICATOR (WOUND CARE) ×2 IMPLANT
ELECT REM PT RETURN 15FT ADLT (MISCELLANEOUS) ×2 IMPLANT
GLOVE BIO SURGEON STRL SZ 6.5 (GLOVE) IMPLANT
GLOVE BIO SURGEON STRL SZ7.5 (GLOVE) IMPLANT
GLOVE BIO SURGEON STRL SZ8 (GLOVE) ×2 IMPLANT
GLOVE BIOGEL PI IND STRL 6.5 (GLOVE) IMPLANT
GLOVE BIOGEL PI IND STRL 7.0 (GLOVE) IMPLANT
GLOVE BIOGEL PI IND STRL 8 (GLOVE) ×2 IMPLANT
GOWN STRL REUS W/ TWL LRG LVL3 (GOWN DISPOSABLE) ×2 IMPLANT
GOWN STRL REUS W/ TWL XL LVL3 (GOWN DISPOSABLE) IMPLANT
GOWN STRL REUS W/TWL LRG LVL3 (GOWN DISPOSABLE) ×1
GOWN STRL REUS W/TWL XL LVL3 (GOWN DISPOSABLE)
HANDPIECE INTERPULSE COAX TIP (DISPOSABLE) ×1
HOLDER FOLEY CATH W/STRAP (MISCELLANEOUS) IMPLANT
IMMOBILIZER KNEE 20 (SOFTGOODS) ×1
IMMOBILIZER KNEE 20 THIGH 36 (SOFTGOODS) ×2 IMPLANT
KIT TURNOVER KIT A (KITS) IMPLANT
MANIFOLD NEPTUNE II (INSTRUMENTS) ×2 IMPLANT
NS IRRIG 1000ML POUR BTL (IV SOLUTION) ×2 IMPLANT
PACK TOTAL KNEE CUSTOM (KITS) ×2 IMPLANT
PADDING CAST COTTON 6X4 STRL (CAST SUPPLIES) ×4 IMPLANT
PIN STEINMAN FIXATION KNEE (PIN) IMPLANT
PROTECTOR NERVE ULNAR (MISCELLANEOUS) ×2 IMPLANT
SET HNDPC FAN SPRY TIP SCT (DISPOSABLE) ×2 IMPLANT
SPIKE FLUID TRANSFER (MISCELLANEOUS) ×2 IMPLANT
STRIP CLOSURE SKIN 1/2X4 (GAUZE/BANDAGES/DRESSINGS) ×4 IMPLANT
SUT MNCRL AB 4-0 PS2 18 (SUTURE) ×2 IMPLANT
SUT STRATAFIX 0 PDS 27 VIOLET (SUTURE) ×1
SUT VIC AB 2-0 CT1 27 (SUTURE) ×3
SUT VIC AB 2-0 CT1 TAPERPNT 27 (SUTURE) ×6 IMPLANT
SUTURE STRATFX 0 PDS 27 VIOLET (SUTURE) ×2 IMPLANT
TIBIAL BASE ROT PLAT SZ 5 KNEE (Knees) ×1 IMPLANT
TRAY FOLEY MTR SLVR 16FR STAT (SET/KITS/TRAYS/PACK) ×2 IMPLANT
TUBE SUCTION HIGH CAP CLEAR NV (SUCTIONS) ×2 IMPLANT
WATER STERILE IRR 1000ML POUR (IV SOLUTION) ×4 IMPLANT
WRAP KNEE MAXI GEL POST OP (GAUZE/BANDAGES/DRESSINGS) ×2 IMPLANT

## 2022-08-13 NOTE — Evaluation (Signed)
Physical Therapy Evaluation Patient Details Name: Courtney [name] MRN: [id number] DOB: 1960-08-17 Today's Date: 08/13/2022  History of Present Illness  Patient is 62 y.o. female s/p Lt TKA on 08/13/22 with PMH significant for OA, HTN, thyroid disease.  Clinical Impression  [name] is a 62 y.o. female POD 0 s/p Lt TKA. Patient reports independence with mobility at baseline. Patient is now limited by functional impairments (see PT problem list below) and requires min-mod assist for transfers and gait with RW. Patient was able to ambulate ~8 feet with RW and min assist and was limited by pain and nausea with mobility. Symptoms improved with seated rest. Patient instructed in exercise to facilitate circulation to manage edema. Patient will benefit from continued skilled PT interventions to address impairments and progress towards PLOF. Acute PT will follow to progress mobility and stair training in preparation for safe discharge home.        Recommendations for follow up therapy are one component of a multi-disciplinary discharge planning process, led by the attending physician.  Recommendations may be updated based on patient status, additional functional criteria and insurance authorization.  Follow Up Recommendations Follow physician's recommendations for discharge plan and follow up therapies      Assistance Recommended at Discharge Frequent or constant Supervision/Assistance  Patient can return home with the following  A little help with walking and/or transfers;A little help with bathing/dressing/bathroom;Assistance with cooking/housework;Assist for transportation;Help with stairs or ramp for entrance    Equipment Recommendations Rolling walker (2 wheels)  Recommendations for Other Services       Functional Status Assessment Patient has had a recent decline in their functional status and demonstrates the ability to make significant improvements in function in a reasonable and  predictable amount of time.     Precautions / Restrictions Precautions Precautions: Fall Restrictions Weight Bearing Restrictions: No Other Position/Activity Restrictions: WBAT      Mobility  Bed Mobility Overal bed mobility: Needs Assistance Bed Mobility: Supine to Sit     Supine to sit: Min assist, HOB elevated     General bed mobility comments: cues to use bed rail and asisst to bring Lt LE off EOB and control lowering at start. pt able to scoot to EOB fully with UE use.    Transfers Overall transfer level: Needs assistance Equipment used: Rolling walker (2 wheels) Transfers: Sit to/from Stand Sit to Stand: Min assist, Mod assist           General transfer comment: Min-Mod assist with EOB elevated and cues for hand placement.    Ambulation/Gait Ambulation/Gait assistance: Min assist Gait Distance (Feet): 8 Feet Assistive device: Rolling walker (2 wheels) Gait Pattern/deviations: Step-to pattern Gait velocity: decr     General Gait Details: cues for step pattern and position to RW, assist to maintain walker stability. close chair follow and seated rest provided due to nauseous sensation. pt avoiding weigth bearing through Lt LE due to pain.  Stairs            Wheelchair Mobility    Modified Rankin (Stroke Patients Only)       Balance Overall balance assessment: Needs assistance Sitting-balance support: Feet supported Sitting balance-Leahy Scale: Good     Standing balance support: Reliant on assistive device for balance, During functional activity, Bilateral upper extremity supported Standing balance-Leahy Scale: Poor  Pertinent Vitals/Pain Pain Assessment Pain Assessment: 0-10 Pain Score: 8  Pain Location: Lt Knee Pain Descriptors / Indicators: Aching, Discomfort Pain Intervention(s): Limited activity within patient's tolerance, Monitored during session, Repositioned, Premedicated before session,  Ice applied    Home Living Family/patient expects to be discharged to:: Private residence Living Arrangements: Alone Available Help at Discharge: Family Type of Home: House Home Access: Stairs to enter Entrance Stairs-Rails: Right Entrance Stairs-Number of Steps: 2+2 (no rial on first 2, Rt on next 2)   Home Layout: One level Home Equipment: None      Prior Function Prior Level of Function : Independent/Modified Independent                     Hand Dominance   Dominant Hand: Right    Extremity/Trunk Assessment   Upper Extremity Assessment Upper Extremity Assessment: Overall WFL for tasks assessed    Lower Extremity Assessment Lower Extremity Assessment: LLE deficits/detail LLE Deficits / Details: good quad activation, no extensor lag with SLR LLE Sensation: WNL LLE Coordination: WNL    Cervical / Trunk Assessment Cervical / Trunk Assessment: Normal  Communication   Communication: No difficulties  Cognition Arousal/Alertness: Awake/alert Behavior During Therapy: WFL for tasks assessed/performed Overall Cognitive Status: Within Functional Limits for tasks assessed                                          General Comments      Exercises Total Joint Exercises Ankle Circles/Pumps: AROM, Both, 15 reps   Assessment/Plan    PT Assessment Patient needs continued PT services  PT Problem List Decreased strength;Decreased range of motion;Decreased activity tolerance;Decreased balance;Decreased mobility;Decreased knowledge of use of DME;Decreased safety awareness;Pain       PT Treatment Interventions DME instruction;Gait training;Stair training;Functional mobility training;Therapeutic activities;Therapeutic exercise;Balance training;Patient/family education    PT Goals (Current goals can be found in the Care Plan section)  Acute Rehab PT Goals Patient Stated Goal: regain independence and knee to stop hurting PT Goal Formulation: With  patient Time For Goal Achievement: 08/20/22 Potential to Achieve Goals: Good    Frequency 7X/week     Co-evaluation               AM-PAC PT "6 Clicks" Mobility  Outcome Measure Help needed turning from your back to your side while in a flat bed without using bedrails?: A Little Help needed moving from lying on your back to sitting on the side of a flat bed without using bedrails?: A Little Help needed moving to and from a bed to a chair (including a wheelchair)?: A Little Help needed standing up from a chair using your arms (e.g., wheelchair or bedside chair)?: A Little Help needed to walk in hospital room?: A Little Help needed climbing 3-5 steps with a railing? : A Lot 6 Click Score: 17    End of Session Equipment Utilized During Treatment: Gait belt Activity Tolerance: Patient tolerated treatment well Patient left: in chair;with call bell/phone within reach;with chair alarm set Nurse Communication: Mobility status PT Visit Diagnosis: Muscle weakness (generalized) (M62.81);Difficulty in walking, not elsewhere classified (R26.2);Unsteadiness on feet (R26.81)    Time: 7035-0093 PT Time Calculation (min) (ACUTE ONLY): 27 min   Charges:   PT Evaluation $PT Eval Low Complexity: 1 Low PT Treatments $Gait Training: 8-22 mins        Renaldo Fiddler PT, DPT Acute  Rehabilitation Services Office [phone]  08/13/22 3:45 PM

## 2022-08-13 NOTE — Interval H&P Note (Signed)
History and Physical Interval Note:  08/13/2022 8:18 AM  Courtney [name]  has presented today for surgery, with the diagnosis of left knee osteoarthritis.  The various methods of treatment have been discussed with the patient and family. After consideration of risks, benefits and other options for treatment, the patient has consented to  Procedure(s): TOTAL KNEE ARTHROPLASTY (Left) as a surgical intervention.  The patient's history has been reviewed, patient examined, no change in status, stable for surgery.  I have reviewed the patient's chart and labs.  Questions were answered to the patient's satisfaction.     Pilar Plate Vickii Volland

## 2022-08-13 NOTE — Anesthesia Procedure Notes (Addendum)
Spinal  Start time: 08/13/2022 9:14 AM End time: 08/13/2022 9:18 AM Reason for block: surgical anesthesia Staffing Performed: anesthesiologist  Anesthesiologist: Effie Berkshire, MD Performed by: Effie Berkshire, MD Authorized by: Effie Berkshire, MD   Preanesthetic Checklist Completed: patient identified, IV checked, site marked, risks and benefits discussed, surgical consent, monitors and equipment checked, pre-op evaluation and timeout performed Spinal Block Patient position: sitting Prep: DuraPrep and site prepped and draped Location: L4-5 Injection technique: single-shot Needle Needle type: Pencan  Needle gauge: 24 G Needle length: 10 cm Needle insertion depth: 10 cm Additional Notes Patient tolerated well. No immediate complications. Unable to pass spinal needle at L3-4 level due to patient positioning. Successful at L4-5 after repositioning.   Functioning IV was confirmed and monitors were applied. Sterile prep and drape, including hand hygiene and sterile gloves were used. The patient was positioned and the back was prepped. The skin was anesthetized with lidocaine. Free flow of clear CSF was obtained prior to injecting local anesthetic into the CSF. The spinal needle aspirated freely following injection. The needle was carefully withdrawn. The patient tolerated the procedure well.

## 2022-08-13 NOTE — Anesthesia Procedure Notes (Signed)
Anesthesia Regional Block: Adductor canal block   Pre-Anesthetic Checklist: , timeout performed,  Correct Patient, Correct Site, Correct Laterality,  Correct Procedure, Correct Position, site marked,  Risks and benefits discussed,  Surgical consent,  Pre-op evaluation,  At surgeon's request and post-op pain management  Laterality: Left  Prep: chloraprep       Needles:  Injection technique: Single-shot  Needle Type: Echogenic Stimulator Needle     Needle Length: 9cm  Needle Gauge: 21     Additional Needles:   Procedures:,,,, ultrasound used (permanent image in chart),,    Narrative:  Start time: 08/13/2022 8:45 AM End time: 08/13/2022 8:50 AM Injection made incrementally with aspirations every 5 mL.  Performed by: Personally  Anesthesiologist: Effie Berkshire, MD  Additional Notes: Discussed risks and benefits of the nerve block in detail, including but not limited vascular injury, permanent nerve damage and infection.   Patient tolerated the procedure well. Local anesthetic introduced in an incremental fashion under minimal resistance after negative aspirations. No paresthesias were elicited. After completion of the procedure, no acute issues were identified and patient continued to be monitored by RN.

## 2022-08-13 NOTE — Anesthesia Preprocedure Evaluation (Addendum)
Anesthesia Evaluation  Patient identified by MRN, date of birth, ID band Patient awake    Reviewed: Allergy & Precautions, NPO status , Patient's Chart, lab work & pertinent test results  Airway Mallampati: II  TM Distance: >3 FB Neck ROM: Full    Dental  (+) Teeth Intact, Dental Advisory Given   Pulmonary neg pulmonary ROS   breath sounds clear to auscultation       Cardiovascular hypertension, Pt. on medications  Rhythm:Regular Rate:Normal     Neuro/Psych  Headaches  negative psych ROS   GI/Hepatic negative GI ROS, Neg liver ROS,,,  Endo/Other  Hypothyroidism    Renal/GU negative Renal ROS     Musculoskeletal  (+) Arthritis ,    Abdominal   Peds  Hematology negative hematology ROS (+)   Anesthesia Other Findings   Reproductive/Obstetrics                             Anesthesia Physical Anesthesia Plan  ASA: 2  Anesthesia Plan: Spinal   Post-op Pain Management: Regional block*   Induction: Intravenous  PONV Risk Score and Plan: 3 and Ondansetron, Dexamethasone, Midazolam and Propofol infusion  Airway Management Planned: Simple Face Mask and Natural Airway  Additional Equipment: None  Intra-op Plan:   Post-operative Plan:   Informed Consent: I have reviewed the patients History and Physical, chart, labs and discussed the procedure including the risks, benefits and alternatives for the proposed anesthesia with the patient or authorized representative who has indicated his/her understanding and acceptance.       Plan Discussed with: CRNA  Anesthesia Plan Comments: (Lab Results      Component                Value               Date                      WBC                      4.2                 08/01/2022                HGB                      14.0                08/01/2022                HCT                      41.6                08/01/2022                MCV                       89.8                08/01/2022                PLT                      209  08/01/2022           )       Anesthesia Quick Evaluation

## 2022-08-13 NOTE — Anesthesia Postprocedure Evaluation (Signed)
Anesthesia Post Note  Patient: Carnella L Stettner  Procedure(s) Performed: TOTAL KNEE ARTHROPLASTY (Left: Knee)     Patient location during evaluation: PACU Anesthesia Type: Spinal Level of consciousness: oriented and awake and alert Pain management: pain level controlled Vital Signs Assessment: post-procedure vital signs reviewed and stable Respiratory status: spontaneous breathing, respiratory function stable and patient connected to nasal cannula oxygen Cardiovascular status: blood pressure returned to baseline and stable Postop Assessment: no headache, no backache, no apparent nausea or vomiting and spinal receding Anesthetic complications: no  No notable events documented.  Last Vitals:  Vitals:   08/13/22 1205 08/13/22 1337  BP: (!) 148/101 (!) 147/101  Pulse: (!) 53 67  Resp: 16 16  Temp:  (!) 36.4 C  SpO2: 95% 95%    Last Pain:  Vitals:   08/13/22 1442  TempSrc:   PainSc: [place] Jayvier Burgher

## 2022-08-13 NOTE — Plan of Care (Signed)
  Problem: Education: Goal: Knowledge of the prescribed therapeutic regimen will improve Outcome: Progressing   Problem: Activity: Goal: Ability to avoid complications of mobility impairment will improve Outcome: Progressing   Problem: Activity: Goal: Range of joint motion will improve Outcome: Progressing   Problem: Clinical Measurements: Goal: Postoperative complications will be avoided or minimized Outcome: Progressing   Problem: Pain Management: Goal: Pain level will decrease with appropriate interventions Outcome: Progressing   Problem: Education: Goal: Knowledge of General Education information will improve Description: Including pain rating scale, medication(s)/side effects and non-pharmacologic comfort measures Outcome: Progressing   Problem: Nutrition: Goal: Adequate nutrition will be maintained Outcome: Progressing   Problem: Coping: Goal: Level of anxiety will decrease Outcome: Progressing   Problem: Elimination: Goal: Will not experience complications related to bowel motility Outcome: Progressing   Problem: Pain Managment: Goal: General experience of comfort will improve Outcome: Progressing   Problem: Safety: Goal: Ability to remain free from injury will improve Outcome: Progressing

## 2022-08-13 NOTE — Op Note (Signed)
OPERATIVE REPORT-TOTAL KNEE ARTHROPLASTY   Pre-operative diagnosis- Osteoarthritis  Left knee(s)  Post-operative diagnosis- Osteoarthritis Left knee(s)  Procedure-  Left  Total Knee Arthroplasty  Surgeon- Gus Rankin. Tanganika Barradas, MD  Assistant- Weston Brass, PA-C   Anesthesia-   Adductor canal block and spinal  EBL-25 mL   Drains None  Tourniquet time-  Total Tourniquet Time Documented: Thigh (Left) - 37 minutes Total: Thigh (Left) - 37 minutes     Complications- None  Condition-PACU - hemodynamically stable.   Brief Clinical Note  Courtney [name] is a 62 y.o. year old female with end stage OA of her left knee with progressively worsening pain and dysfunction. She has constant pain, with activity and at rest and significant functional deficits with difficulties even with ADLs. She has had extensive non-op management including analgesics, injections of cortisone and viscosupplements, and home exercise program, but remains in significant pain with significant dysfunction. Radiographs show bone on bone arthritis lateral and patellofemoral. She presents now for left Total Knee Arthroplasty.     Procedure in detail---   The patient is brought into the operating room and positioned supine on the operating table. After successful administration of  Adductor canal block and spinal,   a tourniquet is placed high on the  Left thigh(s) and the lower extremity is prepped and draped in the usual sterile fashion. Time out is performed by the operating team and then the  Left lower extremity is wrapped in Esmarch, knee flexed and the tourniquet inflated to 300 mmHg.       A midline incision is made with a ten blade through the subcutaneous tissue to the level of the extensor mechanism. A fresh blade is used to make a medial parapatellar arthrotomy. Soft tissue over the proximal medial tibia is subperiosteally elevated to the joint line with a knife and into the semimembranosus bursa with a  Cobb elevator. Soft tissue over the proximal lateral tibia is elevated with attention being paid to avoiding the patellar tendon on the tibial tubercle. The patella is everted, knee flexed 90 degrees and the ACL and PCL are removed. Findings are bone on bone lateral and patellofemoral        The drill is used to create a starting hole in the distal femur and the canal is thoroughly irrigated with sterile saline to remove the fatty contents. The 5 degree Left  valgus alignment guide is placed into the femoral canal and the distal femoral cutting block is pinned to remove 9 mm off the distal femur. Resection is made with an oscillating saw.      The tibia is subluxed forward and the menisci are removed. The extramedullary alignment guide is placed referencing proximally at the medial aspect of the tibial tubercle and distally along the second metatarsal axis and tibial crest. The block is pinned to remove 55mm off the more deficient lateral  side. Resection is made with an oscillating saw. Size 5is the most appropriate size for the tibia and the proximal tibia is prepared with the modular drill and keel punch for that size.      The femoral sizing guide is placed and size 6 is most appropriate. Rotation is marked off the epicondylar axis and confirmed by creating a rectangular flexion gap at 90 degrees. The size 6 cutting block is pinned in this rotation and the anterior, posterior and chamfer cuts are made with the oscillating saw. The intercondylar block is then placed and that cut is made.  Trial size 5 tibial component, trial size 6 narrow posterior stabilized femur and a 8  mm posterior stabilized rotating platform insert trial is placed. Full extension is achieved with excellent varus/valgus and anterior/posterior balance throughout full range of motion. The patella is everted and thickness measured to be 24  mm. Free hand resection is taken to 14 mm, a 38 template is placed, lug holes are drilled, trial  patella is placed, and it tracks normally. Osteophytes are removed off the posterior femur with the trial in place. All trials are removed and the cut bone surfaces prepared with pulsatile lavage. Cement is mixed and once ready for implantation, the size 5 tibial implant, size  6 narrow posterior stabilized femoral component, and the size 38 patella are cemented in place and the patella is held with the clamp. The trial insert is placed and the knee held in full extension. The Exparel (20 ml mixed with 60 ml saline) is injected into the extensor mechanism, posterior capsule, medial and lateral gutters and subcutaneous tissues.  All extruded cement is removed and once the cement is hard the permanent 8 mm posterior stabilized rotating platform insert is placed into the tibial tray.      The wound is copiously irrigated with saline solution and the extensor mechanism closed with # 0 Stratofix suture. The tourniquet is released for a total tourniquet time of 37  minutes. Flexion against gravity is 140 degrees and the patella tracks normally. Subcutaneous tissue is closed with 2.0 vicryl and subcuticular with running 4.0 Monocryl. The incision is cleaned and dried and steri-strips and a bulky sterile dressing are applied. The limb is placed into a knee immobilizer and the patient is awakened and transported to recovery in stable condition.      Please note that a surgical assistant was a medical necessity for this procedure in order to perform it in a safe and expeditious manner. Surgical assistant was necessary to retract the ligaments and vital neurovascular structures to prevent injury to them and also necessary for proper positioning of the limb to allow for anatomic placement of the prosthesis.   Dione Plover Brandonn Capelli, MD    08/13/2022, 10:28 AM

## 2022-08-13 NOTE — Transfer of Care (Signed)
Immediate Anesthesia Transfer of Care Note  Patient: Courtney [name]  Procedure(s) Performed: TOTAL KNEE ARTHROPLASTY (Left: Knee)  Patient Location: PACU  Anesthesia Type:Spinal  Level of Consciousness: awake and patient cooperative  Airway & Oxygen Therapy: mask  Post-op Assessment: Report given to RN and Post -op Vital signs reviewed and stable  Post vital signs: Reviewed and stable  Last Vitals:  Vitals Value Taken Time  BP 111/90 08/13/22 1053  Temp    Pulse 64 08/13/22 1057  Resp 16 08/13/22 1057  SpO2 98 % 08/13/22 1057  Vitals shown include unvalidated device data.  Last Pain:  Vitals:   08/13/22 0855  TempSrc:   PainSc: Asleep         Complications: No notable events documented.

## 2022-08-13 NOTE — Discharge Instructions (Addendum)
 Frank Aluisio, MD Total Joint Specialist [facility] [address] [place], [place] [address] [phone]  TOTAL KNEE REPLACEMENT POSTOPERATIVE DIRECTIONS    Knee Rehabilitation, Guidelines Following Surgery  Results after knee surgery are often greatly improved when you follow the exercise, range of motion and muscle strengthening exercises prescribed by your doctor. Safety measures are also important to protect the knee from further injury. If any of these exercises cause you to have increased pain or swelling in your knee joint, decrease the amount until you are comfortable again and slowly increase them. If you have problems or questions, call your caregiver or physical therapist for advice.   BLOOD CLOT PREVENTION Take a 10 mg Xarelto once a day for three weeks following surgery. Then take an 81 mg Aspirin once a day for three weeks. Then discontinue Aspirin. You may resume your vitamins/supplements once you have discontinued the Xarelto. Do not take any NSAIDs (Advil, Aleve, Ibuprofen, Meloxicam, etc.) until you have discontinued the Xarelto.   HOME CARE INSTRUCTIONS  Remove items at home which could result in a fall. This includes throw rugs or furniture in walking pathways.  ICE to the affected knee as much as tolerated. Icing helps control swelling. If the swelling is well controlled you will be more comfortable and rehab easier. Continue to use ice on the knee for pain and swelling from surgery. You may notice swelling that will progress down to the foot and ankle. This is normal after surgery. Elevate the leg when you are not up walking on it.    Continue to use the breathing machine which will help keep your temperature down. It is common for your temperature to cycle up and down following surgery, especially at night when you are not up moving around and exerting yourself. The breathing machine keeps your lungs expanded and your temperature  down. Do not place pillow under the operative knee, focus on keeping the knee straight while resting  DIET You may resume your previous home diet once you are discharged from the hospital.  DRESSING / WOUND CARE / SHOWERING Keep your bulky bandage on for 2 days. On the third post-operative day you may remove the Ace bandage and gauze. There is a waterproof adhesive bandage on your skin which will stay in place until your first follow-up appointment. Once you remove this you will not need to place another bandage You may begin showering 3 days following surgery, but do not submerge the incision under water.  ACTIVITY For the first 5 days, the key is rest and control of pain and swelling Do your home exercises twice a day starting on post-operative day 3. On the days you go to physical therapy, just do the home exercises once that day. You should rest, ice and elevate the leg for 50 minutes out of every hour. Get up and walk/stretch for 10 minutes per hour. After 5 days you can increase your activity slowly as tolerated. Walk with your walker as instructed. Use the walker until you are comfortable transitioning to a cane. Walk with the cane in the opposite hand of the operative leg. You may discontinue the cane once you are comfortable and walking steadily. Avoid periods of inactivity such as sitting longer than an hour when not asleep. This helps prevent blood clots.  You may discontinue the knee immobilizer once you are able to perform a straight leg raise while lying down. You may resume a sexual relationship in one month or   when given the OK by your doctor.  You may return to work once you are cleared by your doctor.  Do not drive a car for 6 weeks or until released by your surgeon.  Do not drive while taking narcotics.  TED HOSE STOCKINGS Wear the elastic stockings on both legs for three weeks following surgery during the day. You may remove them at night for sleeping.  WEIGHT  BEARING Weight bearing as tolerated with assist device (walker, cane, etc) as directed, use it as long as suggested by your surgeon or therapist, typically at least 4-6 weeks.  POSTOPERATIVE CONSTIPATION PROTOCOL Constipation - defined medically as fewer than three stools per week and severe constipation as less than one stool per week.  One of the most common issues patients have following surgery is constipation.  Even if you have a regular bowel pattern at home, your normal regimen is likely to be disrupted due to multiple reasons following surgery.  Combination of anesthesia, postoperative narcotics, change in appetite and fluid intake all can affect your bowels.  In order to avoid complications following surgery, here are some recommendations in order to help you during your recovery period.  Colace (docusate) - Pick up an over-the-counter form of Colace or another stool softener and take twice a day as long as you are requiring postoperative pain medications.  Take with a full glass of water daily.  If you experience loose stools or diarrhea, hold the colace until you stool forms back up. If your symptoms do not get better within 1 week or if they get worse, check with your doctor. Dulcolax (bisacodyl) - Pick up over-the-counter and take as directed by the product packaging as needed to assist with the movement of your bowels.  Take with a full glass of water.  Use this product as needed if not relieved by Colace only.  MiraLax (polyethylene glycol) - Pick up over-the-counter to have on hand. MiraLax is a solution that will increase the amount of water in your bowels to assist with bowel movements.  Take as directed and can mix with a glass of water, juice, soda, coffee, or tea. Take if you go more than two days without a movement. Do not use MiraLax more than once per day. Call your doctor if you are still constipated or irregular after using this medication for 7 days in a row.  If you continue  to have problems with postoperative constipation, please contact the office for further assistance and recommendations.  If you experience "the worst abdominal pain ever" or develop nausea or vomiting, please contact the office immediatly for further recommendations for treatment.  ITCHING If you experience itching with your medications, try taking only a single pain pill, or even half a pain pill at a time.  You can also use Benadryl over the counter for itching or also to help with sleep.   MEDICATIONS See your medication summary on the "After Visit Summary" that the nursing staff will review with you prior to discharge.  You may have some home medications which will be placed on hold until you complete the course of blood thinner medication.  It is important for you to complete the blood thinner medication as prescribed by your surgeon.  Continue your approved medications as instructed at time of discharge.  PRECAUTIONS If you experience chest pain or shortness of breath - call 911 immediately for transfer to the hospital emergency department.  If you develop a fever greater that 101 F, purulent   drainage from wound, increased redness or drainage from wound, foul odor from the wound/dressing, or calf pain - CONTACT YOUR SURGEON.                                                   FOLLOW-UP APPOINTMENTS Make sure you keep all of your appointments after your operation with your surgeon and caregivers. You should call the office at the above phone number and make an appointment for approximately two weeks after the date of your surgery or on the date instructed by your surgeon outlined in the "After Visit Summary".  RANGE OF MOTION AND STRENGTHENING EXERCISES  Rehabilitation of the knee is important following a knee injury or an operation. After just a few days of immobilization, the muscles of the thigh which control the knee become weakened and shrink (atrophy). Knee exercises are designed to build up  the tone and strength of the thigh muscles and to improve knee motion. Often times heat used for twenty to thirty minutes before working out will loosen up your tissues and help with improving the range of motion but do not use heat for the first two weeks following surgery. These exercises can be done on a training (exercise) mat, on the floor, on a table or on a bed. Use what ever works the best and is most comfortable for you Knee exercises include:  Leg Lifts - While your knee is still immobilized in a splint or cast, you can do straight leg raises. Lift the leg to 60 degrees, hold for 3 sec, and slowly lower the leg. Repeat 10-20 times 2-3 times daily. Perform this exercise against resistance later as your knee gets better.  Quad and Hamstring Sets - Tighten up the muscle on the front of the thigh (Quad) and hold for 5-10 sec. Repeat this 10-20 times hourly. Hamstring sets are done by pushing the foot backward against an object and holding for 5-10 sec. Repeat as with quad sets.  Leg Slides: Lying on your back, slowly slide your foot toward your buttocks, bending your knee up off the floor (only go as far as is comfortable). Then slowly slide your foot back down until your leg is flat on the floor again. Angel Wings: Lying on your back spread your legs to the side as far apart as you can without causing discomfort.  A rehabilitation program following serious knee injuries can speed recovery and prevent re-injury in the future due to weakened muscles. Contact your doctor or a physical therapist for more information on knee rehabilitation.   POST-OPERATIVE OPIOID TAPER INSTRUCTIONS: It is important to wean off of your opioid medication as soon as possible. If you do not need pain medication after your surgery it is ok to stop day one. Opioids include: Codeine, Hydrocodone(Norco, Vicodin), Oxycodone(Percocet, oxycontin) and hydromorphone amongst others.  Long term and even short term use of opiods can  cause: Increased pain response Dependence Constipation Depression Respiratory depression And more.  Withdrawal symptoms can include Flu like symptoms Nausea, vomiting And more Techniques to manage these symptoms Hydrate well Eat regular healthy meals Stay active Use relaxation techniques(deep breathing, meditating, yoga) Do Not substitute Alcohol to help with tapering If you have been on opioids for less than two weeks and do not have pain than it is ok to stop all together.  Plan to   wean off of opioids This plan should start within one week post op of your joint replacement. Maintain the same interval or time between taking each dose and first decrease the dose.  Cut the total daily intake of opioids by one tablet each day Next start to increase the time between doses. The last dose that should be eliminated is the evening dose.   IF YOU ARE TRANSFERRED TO A SKILLED REHAB FACILITY If the patient is transferred to a skilled rehab facility following release from the hospital, a list of the current medications will be sent to the facility for the patient to continue.  When discharged from the skilled rehab facility, please have the facility set up the patient's Home Health Physical Therapy prior to being released. Also, the skilled facility will be responsible for providing the patient with their medications at time of release from the facility to include their pain medication, the muscle relaxants, and their blood thinner medication. If the patient is still at the rehab facility at time of the two week follow up appointment, the skilled rehab facility will also need to assist the patient in arranging follow up appointment in our office and any transportation needs.  MAKE SURE YOU:  Understand these instructions.  Get help right away if you are not doing well or get worse.   DENTAL ANTIBIOTICS:  In most cases prophylactic antibiotics for Dental procdeures after total joint surgery are  not necessary.  Exceptions are as follows:  1. History of prior total joint infection  2. Severely immunocompromised (Organ Transplant, cancer chemotherapy, Rheumatoid biologic meds such as Humera)  3. Poorly controlled diabetes (A1C &gt; 8.0, blood glucose over 200)  If you have one of these conditions, contact your surgeon for an antibiotic prescription, prior to your dental procedure.    Pick up stool softner and laxative for home use following surgery while on pain medications. Do not submerge incision under water. Please use good hand washing techniques while changing dressing each day. May shower starting three days after surgery. Please use a clean towel to pat the incision dry following showers. Continue to use ice for pain and swelling after surgery. Do not use any lotions or creams on the incision until instructed by your surgeon.   Information on my medicine - XARELTO (Rivaroxaban)  This medication education was reviewed with me or my healthcare representative as part of my discharge preparation.  The pharmacist that spoke with me during my hospital stay was:    Why was Xarelto prescribed for you? Xarelto was prescribed for you to reduce the risk of blood clots forming after orthopedic surgery. The medical term for these abnormal blood clots is venous thromboembolism (VTE).  What do you need to know about xarelto ? Take your Xarelto ONCE DAILY at the same time every day. You may take it either with or without food.  If you have difficulty swallowing the tablet whole, you may crush it and mix in applesauce just prior to taking your dose.  Take Xarelto exactly as prescribed by your doctor and DO NOT stop taking Xarelto without talking to the doctor who prescribed the medication.  Stopping without other VTE prevention medication to take the place of Xarelto may increase your risk of developing a clot.  After discharge, you should have regular check-up  appointments with your healthcare provider that is prescribing your Xarelto.    What do you do if you miss a dose? If you miss a dose, take it as   soon as you remember on the same day then continue your regularly scheduled once daily regimen the next day. Do not take two doses of Xarelto on the same day.   Important Safety Information A possible side effect of Xarelto is bleeding. You should call your healthcare provider right away if you experience any of the following: Bleeding from an injury or your nose that does not stop. Unusual colored urine (red or dark brown) or unusual colored stools (red or black). Unusual bruising for unknown reasons. A serious fall or if you hit your head (even if there is no bleeding).  Some medicines may interact with Xarelto and might increase your risk of bleeding while on Xarelto. To help avoid this, consult your healthcare provider or pharmacist prior to using any new prescription or non-prescription medications, including herbals, vitamins, non-steroidal anti-inflammatory drugs (NSAIDs) and supplements.  This website has more information on Xarelto: www.xarelto.com.   

## 2022-08-13 NOTE — Anesthesia Procedure Notes (Signed)
Procedure Name: MAC Date/Time: 08/13/2022 9:25 AM  Performed by: Claudia Desanctis, CRNAPre-anesthesia Checklist: Patient identified, Emergency Drugs available, Suction available and Patient being monitored Patient Re-evaluated:Patient Re-evaluated prior to induction Oxygen Delivery Method: Simple face mask

## 2022-08-14 DIAGNOSIS — Z79899 Other long term (current) drug therapy: Secondary | ICD-10-CM | POA: Diagnosis not present

## 2022-08-14 DIAGNOSIS — Z7982 Long term (current) use of aspirin: Secondary | ICD-10-CM | POA: Diagnosis not present

## 2022-08-14 DIAGNOSIS — M1712 Unilateral primary osteoarthritis, left knee: Secondary | ICD-10-CM | POA: Diagnosis not present

## 2022-08-14 DIAGNOSIS — I10 Essential (primary) hypertension: Secondary | ICD-10-CM | POA: Diagnosis not present

## 2022-08-14 LAB — CBC
HCT: 36.6 % (ref 36.0–46.0)
Hemoglobin: 12 g/dL (ref 12.0–15.0)
MCH: 30.5 pg (ref 26.0–34.0)
MCHC: 32.8 g/dL (ref 30.0–36.0)
MCV: 93.1 fL (ref 80.0–100.0)
Platelets: 216 10*3/uL (ref 150–400)
RBC: 3.93 MIL/uL (ref 3.87–5.11)
RDW: 13.4 % (ref 11.5–15.5)
WBC: 10.6 10*3/uL — ABNORMAL HIGH (ref 4.0–10.5)
nRBC: 0 % (ref 0.0–0.2)

## 2022-08-14 LAB — BASIC METABOLIC PANEL
Anion gap: 7 (ref 5–15)
BUN: 17 mg/dL (ref 6–20)
CO2: 22 mmol/L (ref 22–32)
Calcium: 8 mg/dL — ABNORMAL LOW (ref 8.9–10.3)
Chloride: 106 mmol/L (ref 98–111)
Creatinine, Ser: 0.74 mg/dL (ref 0.44–1.00)
GFR, Estimated: >60 mL/min (ref >60)
Glucose, Bld: 145 mg/dL — ABNORMAL HIGH (ref 70–99)
Potassium: 3.4 mmol/L — ABNORMAL LOW (ref 3.5–5.1)
Sodium: 135 mmol/L (ref 135–145)

## 2022-08-14 MED ORDER — TRAMADOL HCL 50 MG PO TABS: 50.0000 mg | ORAL_TABLET | Freq: Four times a day (QID) | ORAL | 0 refills | Status: AC | PRN

## 2022-08-14 MED ORDER — OXYCODONE HCL 5 MG PO TABS: 5.0000 mg | ORAL_TABLET | Freq: Four times a day (QID) | ORAL | 0 refills | Status: AC | PRN

## 2022-08-14 MED ORDER — METHOCARBAMOL 500 MG PO TABS: 500.0000 mg | ORAL_TABLET | Freq: Four times a day (QID) | ORAL | 0 refills | Status: AC | PRN

## 2022-08-14 MED ORDER — GABAPENTIN 300 MG PO CAPS: 300.0000 mg | ORAL_CAPSULE | ORAL | 0 refills | Status: AC

## 2022-08-14 MED ORDER — RIVAROXABAN 10 MG PO TABS: 10.0000 mg | ORAL_TABLET | Freq: Every day | ORAL | 0 refills | Status: AC

## 2022-08-14 NOTE — Progress Notes (Signed)
Physical Therapy Treatment Patient Details Name: Courtney [name] MRN: [id number] DOB: [birth year] Today's Date: 08/14/2022   History of Present Illness Patient is 62 y.o. female s/p Lt TKA on 08/13/22 with PMH significant for OA, HTN, thyroid disease.    PT Comments    Patient making good progress with mobility. No LOB noted during transfers and gait and min guard only for safety. Intermittent cues for step pattern with RW, pt ambulated ~160' with no buckling and no c/o nausea. EOS reviewed HEP in sitting and ice applied. Will continue to progress pt as able in preparation for safe discharge home.   Recommendations for follow up therapy are one component of a multi-disciplinary discharge planning process, led by the attending physician.  Recommendations may be updated based on patient status, additional functional criteria and insurance authorization.  Follow Up Recommendations  Follow physician's recommendations for discharge plan and follow up therapies     Assistance Recommended at Discharge Frequent or constant Supervision/Assistance  Patient can return home with the following A little help with walking and/or transfers;A little help with bathing/dressing/bathroom;Assistance with cooking/housework;Assist for transportation;Help with stairs or ramp for entrance   Equipment Recommendations  Rolling walker (2 wheels)    Recommendations for Other Services       Precautions / Restrictions Precautions Precautions: Fall Restrictions Weight Bearing Restrictions: No Other Position/Activity Restrictions: WBAT     Mobility  Bed Mobility               General bed mobility comments: OOB in recliner    Transfers Overall transfer level: Needs assistance Equipment used: Rolling walker (2 wheels) Transfers: Sit to/from Stand Sit to Stand: Min guard           General transfer comment: min gurad for safe power up from recliner, cues to use bil UE to initiate rise.     Ambulation/Gait Ambulation/Gait assistance: Min guard Gait Distance (Feet): 160 Feet Assistive device: Rolling walker (2 wheels) Gait Pattern/deviations: Step-to pattern Gait velocity: decr     General Gait Details: guard for safety, no buckling noted. intermittent cues to keep walker in safe position and to maintain step to pattern.   Stairs             Wheelchair Mobility    Modified Rankin (Stroke Patients Only)       Balance Overall balance assessment: Needs assistance Sitting-balance support: Feet supported Sitting balance-Leahy Scale: Good     Standing balance support: Reliant on assistive device for balance, During functional activity, Bilateral upper extremity supported Standing balance-Leahy Scale: Poor                              Cognition Arousal/Alertness: Awake/alert Behavior During Therapy: WFL for tasks assessed/performed Overall Cognitive Status: Within Functional Limits for tasks assessed                                          Exercises Total Joint Exercises Ankle Circles/Pumps: AROM, Both, 15 reps Quad Sets: AROM, Left, 10 reps Heel Slides: Left, 10 reps, AAROM    General Comments        Pertinent Vitals/Pain Pain Assessment Pain Assessment: 0-10 Pain Location: Lt Knee Pain Descriptors / Indicators: Aching, Discomfort Pain Intervention(s): Limited activity within patient's tolerance, Monitored during session, Repositioned    Home Living  Prior Function            PT Goals (current goals can now be found in the care plan section) Acute Rehab PT Goals Patient Stated Goal: regain independence and knee to stop hurting PT Goal Formulation: With patient Time For Goal Achievement: 08/20/22 Potential to Achieve Goals: Good Progress towards PT goals: Progressing toward goals    Frequency    7X/week      PT Plan Current plan remains appropriate     Co-evaluation              AM-PAC PT "6 Clicks" Mobility   Outcome Measure  Help needed turning from your back to your side while in a flat bed without using bedrails?: A Little Help needed moving from lying on your back to sitting on the side of a flat bed without using bedrails?: A Little Help needed moving to and from a bed to a chair (including a wheelchair)?: A Little Help needed standing up from a chair using your arms (e.g., wheelchair or bedside chair)?: A Little Help needed to walk in hospital room?: A Little Help needed climbing 3-5 steps with a railing? : A Lot 6 Click Score: 17    End of Session Equipment Utilized During Treatment: Gait belt Activity Tolerance: Patient tolerated treatment well Patient left: in chair;with call bell/phone within reach;with chair alarm set Nurse Communication: Mobility status PT Visit Diagnosis: Muscle weakness (generalized) (M62.81);Difficulty in walking, not elsewhere classified (R26.2);Unsteadiness on feet (R26.81)     Time: 6286-3817 PT Time Calculation (min) (ACUTE ONLY): 26 min  Charges:  $Gait Training: 8-22 mins $Therapeutic Exercise: 8-22 mins                     Wynn Maudlin, DPT Acute Rehabilitation Services Office [phone]  08/14/22 11:03 AM

## 2022-08-14 NOTE — Progress Notes (Signed)
Physical Therapy Treatment Patient Details Name: Courtney [name] MRN: [id number] DOB: June 25, 1961 Today's Date: 08/14/2022   History of Present Illness Patient is 62 y.o. female s/p Lt TKA on 08/13/22 with PMH significant for OA, HTN, thyroid disease.    PT Comments    Patient limited this session by increased pain in Lt knee but motivated to progress gait and stair training. Min guard for safety throughout, pt demonstrated good recall for transfers and gait. VC's for sequencing reverse step pattern with RW and assist to stabilize RW completed. Pt able to negotiate 2 steps with walker. EOS returned to bed for rest and encouraged to complete ankle pumps for circulation and edema management.   Recommendations for follow up therapy are one component of a multi-disciplinary discharge planning process, led by the attending physician.  Recommendations may be updated based on patient status, additional functional criteria and insurance authorization.  Follow Up Recommendations  Follow physician's recommendations for discharge plan and follow up therapies     Assistance Recommended at Discharge Frequent or constant Supervision/Assistance  Patient can return home with the following A little help with walking and/or transfers;A little help with bathing/dressing/bathroom;Assistance with cooking/housework;Assist for transportation;Help with stairs or ramp for entrance   Equipment Recommendations  Rolling walker (2 wheels)    Recommendations for Other Services       Precautions / Restrictions Precautions Precautions: Fall Restrictions Weight Bearing Restrictions: No Other Position/Activity Restrictions: WBAT     Mobility  Bed Mobility Overal bed mobility: Needs Assistance Bed Mobility: Sit to Supine       Sit to supine: Min assist   General bed mobility comments: pt able to initiate return to supine. assist to bring Lt LE onto bed at EOS.    Transfers Overall transfer level: Needs  assistance Equipment used: Rolling walker (2 wheels) Transfers: Sit to/from Stand Sit to Stand: Min guard           General transfer comment: good carryover for rise and lower to recliner. pt able to complete step pivot chair>bed at EOS with guarding and min cues only.    Ambulation/Gait Ambulation/Gait assistance: Min guard Gait Distance (Feet): 30 Feet Assistive device: Rolling walker (2 wheels) Gait Pattern/deviations: Step-to pattern Gait velocity: decr     General Gait Details: pt with safe step pattern and position to RW, no cues needed. guarding for safety.   Stairs Stairs: Yes Stairs assistance: Min assist Stair Management: No rails, Step to pattern, Backwards, With walker Number of Stairs: 2 General stair comments: cues for sequencing use of RW and step pattern "up with good, down with bad" for stair mobility. assist to stabilize RW position.   Wheelchair Mobility    Modified Rankin (Stroke Patients Only)       Balance Overall balance assessment: Needs assistance Sitting-balance support: Feet supported Sitting balance-Leahy Scale: Good     Standing balance support: Reliant on assistive device for balance, During functional activity, Bilateral upper extremity supported Standing balance-Leahy Scale: Poor                              Cognition Arousal/Alertness: Awake/alert Behavior During Therapy: WFL for tasks assessed/performed Overall Cognitive Status: Within Functional Limits for tasks assessed  Exercises Total Joint Exercises Ankle Circles/Pumps: AROM, Both, 15 reps Quad Sets: AROM, Left, 10 reps Heel Slides: Left, 10 reps, AAROM    General Comments        Pertinent Vitals/Pain Pain Assessment Pain Assessment: 0-10 Pain Score: 8  Pain Location: Lt Knee Pain Descriptors / Indicators: Aching, Discomfort Pain Intervention(s): Limited activity within patient's tolerance,  Monitored during session, Repositioned    Home Living                          Prior Function            PT Goals (current goals can now be found in the care plan section) Acute Rehab PT Goals Patient Stated Goal: regain independence and knee to stop hurting PT Goal Formulation: With patient Time For Goal Achievement: 08/20/22 Potential to Achieve Goals: Good Progress towards PT goals: Progressing toward goals    Frequency    7X/week      PT Plan Current plan remains appropriate    Co-evaluation              AM-PAC PT "6 Clicks" Mobility   Outcome Measure  Help needed turning from your back to your side while in a flat bed without using bedrails?: A Little Help needed moving from lying on your back to sitting on the side of a flat bed without using bedrails?: A Little Help needed moving to and from a bed to a chair (including a wheelchair)?: A Little Help needed standing up from a chair using your arms (e.g., wheelchair or bedside chair)?: A Little Help needed to walk in hospital room?: A Little Help needed climbing 3-5 steps with a railing? : A Little 6 Click Score: 18    End of Session Equipment Utilized During Treatment: Gait belt Activity Tolerance: Patient tolerated treatment well Patient left: in chair;with call bell/phone within reach;with chair alarm set Nurse Communication: Mobility status PT Visit Diagnosis: Muscle weakness (generalized) (M62.81);Difficulty in walking, not elsewhere classified (R26.2);Unsteadiness on feet (R26.81)     Time: 8315-1761 PT Time Calculation (min) (ACUTE ONLY): 16 min  Charges:  $Gait Training: 8-22 mins                     Wynn Maudlin, DPT Acute Rehabilitation Services Office [phone]  08/14/22 3:53 PM

## 2022-08-14 NOTE — Progress Notes (Signed)
   Subjective: 1 Day Post-Op Procedure(s) (LRB): TOTAL KNEE ARTHROPLASTY (Left) Patient seen in rounds by Dr. Wynelle Link. Patient is well, and has had no acute complaints or problems. Denies SOB or chest pain. Foley cath to be removed this AM.  Patient reports pain as moderate. Worked with physical therapy yesterday and ambulated about 8'. We will continue physical therapy today.  Objective: Vital signs in last 24 hours: Temp:  [97.5 F (36.4 C)-98.7 F (37.1 C)] 98.5 F (36.9 C) (01/09 0602) Pulse Rate:  [53-81] 69 (01/09 0602) Resp:  [14-22] 17 (01/09 0602) BP: (103-199)/(71-114) 126/79 (01/09 0602) SpO2:  [94 %-100 %] 94 % (01/09 0602) Weight:  [89.4 kg] 89.4 kg (01/08 0722)  Intake/Output from previous day:  Intake/Output Summary (Last 24 hours) at 08/14/2022 0721 Last data filed at 08/14/2022 0458 Gross per 24 hour  Intake 3321.64 ml  Output 3175 ml  Net 146.64 ml     Intake/Output this shift: No intake/output data recorded.  Labs: Recent Labs    08/14/22 0342  HGB 12.0   Recent Labs    08/14/22 0342  WBC 10.6*  RBC 3.93  HCT 36.6  PLT 216   Recent Labs    08/14/22 0342  NA 135  K 3.4*  CL 106  CO2 22  BUN 17  CREATININE 0.74  GLUCOSE 145*  CALCIUM 8.0*   No results for input(s): "LABPT", "INR" in the last 72 hours.  Exam: General - Patient is Alert and Oriented Extremity - Neurologically intact Neurovascular intact Sensation intact distally Dorsiflexion/Plantar flexion intact Dressing - dressing C/D/I Motor Function - intact, moving foot and toes well on exam.  Past Medical History:  Diagnosis Date   Allergy    Arthritis    Family history of adverse reaction to anesthesia    Headache    Herpes    Hx of blood clots 08/06/1998   left leg x1    Hypertension    Thyroid disease     Assessment/Plan: 1 Day Post-Op Procedure(s) (LRB): TOTAL KNEE ARTHROPLASTY (Left) Principal Problem:   Unilateral primary osteoarthritis, left knee Active  Problems:   Primary osteoarthritis of left knee  Estimated body mass index is 29.95 kg/m as calculated from the following:   Height as of this encounter: 5\' 8"  (1.727 m).   Weight as of this encounter: 89.4 kg. Advance diet Up with therapy D/C IV fluids  Patient's anticipated LOS is less than 2 midnights, meeting these requirements: - Lives within 1 hour of care - Has a competent adult at home to recover with post-op - NO history of  - Chronic pain requiring opiods  - Diabetes  - Coronary Artery Disease  - Heart failure  - Heart attack  - Stroke  - DVT/VTE  - Cardiac arrhythmia  - Respiratory Failure/COPD  - Renal failure  - Anemia  - Advanced Liver disease  DVT Prophylaxis -  Xarelto Weight bearing as tolerated.  Continue physical therapy. Possible discharge home today pending progress with PT and if meeting patient goals. Low threshold to stay additional night in hospital to maximize mobility. Plan for O[facility] at [facility] once discharged.  The PDMP database was reviewed today prior to any opioid medications being prescribed to this patient.  R. Jaynie Bream, PA-C Orthopedic Surgery [phone] 08/14/2022, 7:21 AM

## 2022-08-14 NOTE — TOC Transition Note (Signed)
Transition of Care [facility]) - CM/SW Discharge Note  Patient Details  Name: Courtney [name] MRN: [id number] Date of Birth: [birth year]  [facility] CM/SW Contact:  Sherie Don, LCSW Phone Number: 08/14/2022, 10:17 AM  Clinical Narrative: Patient is expected to discharge home after working with PT. CSW met with patient to confirm discharge plan and needs. Patient will go home with OPPT at [facility]. Patient will need a rolling walker, which [employer] will deliver to patient's room. TOC signing off.   Final next level of care: OP Rehab Barriers to Discharge: No Barriers Identified  Patient Goals and CMS Choice CMS Medicare.gov Compare Post Acute Care list provided to:: Patient Choice offered to / list presented to : Patient  Discharge Plan and Services Additional resources added to the After Visit Summary for         DME Arranged: Walker rolling DME Agency: [employer] Representative spoke with at DME Agency: Prearranged in orthopedist's office  Social Determinants of Health ([place]) Interventions SDOH Screenings   Food Insecurity: No Food Insecurity (08/13/2022)  Housing: Low Risk  (08/13/2022)  Transportation Needs: No Transportation Needs (08/13/2022)  Utilities: Not At Risk (08/13/2022)  Tobacco Use: Low Risk  (08/13/2022)   Readmission Risk Interventions     No data to display

## 2022-08-15 ENCOUNTER — Encounter (HOSPITAL_COMMUNITY): Payer: Self-pay | Admitting: Orthopedic Surgery

## 2022-08-15 DIAGNOSIS — Z79899 Other long term (current) drug therapy: Secondary | ICD-10-CM | POA: Diagnosis not present

## 2022-08-15 DIAGNOSIS — I10 Essential (primary) hypertension: Secondary | ICD-10-CM | POA: Diagnosis not present

## 2022-08-15 DIAGNOSIS — Z7982 Long term (current) use of aspirin: Secondary | ICD-10-CM | POA: Diagnosis not present

## 2022-08-15 DIAGNOSIS — M1712 Unilateral primary osteoarthritis, left knee: Secondary | ICD-10-CM | POA: Diagnosis not present

## 2022-08-15 LAB — CBC
HCT: 35.9 % — ABNORMAL LOW (ref 36.0–46.0)
Hemoglobin: 11.7 g/dL — ABNORMAL LOW (ref 12.0–15.0)
MCH: 29.8 pg (ref 26.0–34.0)
MCHC: 32.6 g/dL (ref 30.0–36.0)
MCV: 91.3 fL (ref 80.0–100.0)
Platelets: 214 10*3/uL (ref 150–400)
RBC: 3.93 MIL/uL (ref 3.87–5.11)
RDW: 13.6 % (ref 11.5–15.5)
WBC: 10.7 10*3/uL — ABNORMAL HIGH (ref 4.0–10.5)
nRBC: 0 % (ref 0.0–0.2)

## 2022-08-15 NOTE — Progress Notes (Addendum)
   Subjective: 2 Days Post-Op Procedure(s) (LRB): TOTAL KNEE ARTHROPLASTY (Left) Patient reports pain as mild.   Patient seen in rounds by Dr. Wynelle Link. Patient is well, and has had no acute complaints or problems. Progressing well with PT, ambulated 67' with RW yesterday afternoon. Plan is to go Home after hospital stay. Expect d/c home this afternoon when her transportation is available.  Objective: Vital signs in last 24 hours: Temp:  [98.8 F (37.1 C)-99.4 F (37.4 C)] 99.1 F (37.3 C) (01/10 0630) Pulse Rate:  [76-102] 90 (01/10 0630) Resp:  [16-18] 16 (01/10 0630) BP: (108-175)/(70-107) 175/107 (01/10 0630) SpO2:  [91 %-95 %] 94 % (01/10 0630)  Intake/Output from previous day:  Intake/Output Summary (Last 24 hours) at 08/15/2022 0736 Last data filed at 08/14/2022 1900 Gross per 24 hour  Intake 578.3 ml  Output 450 ml  Net 128.3 ml    Intake/Output this shift: No intake/output data recorded.  Labs: Recent Labs    08/14/22 0342 08/15/22 0334  HGB 12.0 11.7*   Recent Labs    08/14/22 0342 08/15/22 0334  WBC 10.6* 10.7*  RBC 3.93 3.93  HCT 36.6 35.9*  PLT 216 214   Recent Labs    08/14/22 0342  NA 135  K 3.4*  CL 106  CO2 22  BUN 17  CREATININE 0.74  GLUCOSE 145*  CALCIUM 8.0*   No results for input(s): "LABPT", "INR" in the last 72 hours.  Exam: General - Patient is Alert and Oriented Extremity - Neurovascular intact Dorsiflexion/Plantar flexion intact Dressing/Incision - clean, dry, no drainage Motor Function - intact, moving foot and toes well on exam.   Past Medical History:  Diagnosis Date   Allergy    Arthritis    Family history of adverse reaction to anesthesia    Headache    Herpes    Hx of blood clots 08/06/1998   left leg x1    Hypertension    Thyroid disease     Assessment/Plan: 2 Days Post-Op Procedure(s) (LRB): TOTAL KNEE ARTHROPLASTY (Left) Principal Problem:   Unilateral primary osteoarthritis, left knee Active  Problems:   Primary osteoarthritis of left knee  Estimated body mass index is 29.95 kg/m as calculated from the following:   Height as of this encounter: 5\' 8"  (1.727 m).   Weight as of this encounter: 89.4 kg.   DVT Prophylaxis - Xarelto Continue PT while in the hospital. Weight-bearing as tolerated. D/c home this afternoon with friend. OPPT at [facility] scheduled. Meds have been sent to patient's pharmacy.  Shearon Balo, PA-C Orthopedic Surgery 08/15/2022, 7:36 AM

## 2022-08-15 NOTE — Plan of Care (Signed)
Plan of care reviewed and discussed. °

## 2022-08-15 NOTE — Progress Notes (Signed)
Physical Therapy Treatment Patient Details Name: Courtney [name] MRN: [id number] DOB: February 25, 1961 Today's Date: 08/15/2022   History of Present Illness Patient is 62 y.o. female s/p Lt TKA on 08/13/22 with PMH significant for OA, HTN, thyroid disease.    PT Comments    Patient remains limited this session by increased knee pain. She required greater assist this afternoon to complete sit<>stands and ambulate short bout to bathroom and back. Pt had slight LOB through door and with rise from toilet while trying to transition hands to RW. Pt required assist EOS to bring LE's back onto bed and SCD's reapplied in supine as pt had increased edema. Encouraged to continue with ankle pumps and ice knee to manage pain and edema. Will plan to review HEP and stair mobility tomorrow in effort to discharge home.    Recommendations for follow up therapy are one component of a multi-disciplinary discharge planning process, led by the attending physician.  Recommendations may be updated based on patient status, additional functional criteria and insurance authorization.  Follow Up Recommendations  Follow physician's recommendations for discharge plan and follow up therapies     Assistance Recommended at Discharge Frequent or constant Supervision/Assistance  Patient can return home with the following A little help with walking and/or transfers;A little help with bathing/dressing/bathroom;Assistance with cooking/housework;Assist for transportation;Help with stairs or ramp for entrance   Equipment Recommendations  Rolling walker (2 wheels)    Recommendations for Other Services       Precautions / Restrictions Precautions Precautions: Fall Restrictions Weight Bearing Restrictions: No LLE Weight Bearing: Weight bearing as tolerated Other Position/Activity Restrictions: WBAT     Mobility  Bed Mobility Overal bed mobility: Needs Assistance Bed Mobility: Sit to Supine       Sit to supine: Min  assist, HOB elevated   General bed mobility comments: cues to use belt to bring Lt LE but pt unable to fully lift, assist needed to raise leg and fully reposition supine.    Transfers Overall transfer level: Needs assistance Equipment used: Rolling walker (2 wheels) Transfers: Sit to/from Stand Sit to Stand: Min guard, Min assist           General transfer comment: guarding for rise from recliner, pt reliant on bil UE for power up. Min assist needed to steady balance with rise to RW from toilet. pt had LOB with hand transition from grab bar to RW.    Ambulation/Gait Ambulation/Gait assistance: Min guard, Min assist Gait Distance (Feet): 30 Feet Assistive device: Rolling walker (2 wheels) Gait Pattern/deviations: Step-to pattern Gait velocity: decr     General Gait Details: min guard initially with gait to bathroom, slight LOB at door of bathroom and assist to steady provided. assist to turn walker and direct gait back to bed.   Stairs             Wheelchair Mobility    Modified Rankin (Stroke Patients Only)       Balance Overall balance assessment: Needs assistance Sitting-balance support: Feet supported Sitting balance-Leahy Scale: Good     Standing balance support: Reliant on assistive device for balance, During functional activity, Bilateral upper extremity supported Standing balance-Leahy Scale: Poor                              Cognition Arousal/Alertness: Awake/alert Behavior During Therapy: WFL for tasks assessed/performed Overall Cognitive Status: Within Functional Limits for tasks assessed  Exercises Total Joint Exercises Ankle Circles/Pumps: AROM, Both, 15 reps    General Comments        Pertinent Vitals/Pain Pain Assessment Pain Assessment: Faces Faces Pain Scale: Hurts whole lot Pain Location: Lt Knee Pain Descriptors / Indicators: Aching, Discomfort Pain  Intervention(s): Limited activity within patient's tolerance, Monitored during session, Repositioned, Ice applied, Patient requesting pain meds-RN notified    Home Living                          Prior Function            PT Goals (current goals can now be found in the care plan section) Acute Rehab PT Goals Patient Stated Goal: regain independence and knee to stop hurting PT Goal Formulation: With patient Time For Goal Achievement: 08/20/22 Potential to Achieve Goals: Good Progress towards PT goals: Progressing toward goals    Frequency    7X/week      PT Plan Current plan remains appropriate    Co-evaluation              AM-PAC PT "6 Clicks" Mobility   Outcome Measure  Help needed turning from your back to your side while in a flat bed without using bedrails?: A Little Help needed moving from lying on your back to sitting on the side of a flat bed without using bedrails?: A Little Help needed moving to and from a bed to a chair (including a wheelchair)?: A Little Help needed standing up from a chair using your arms (e.g., wheelchair or bedside chair)?: A Little Help needed to walk in hospital room?: A Little Help needed climbing 3-5 steps with a railing? : A Little 6 Click Score: 18    End of Session Equipment Utilized During Treatment: Gait belt Activity Tolerance: Patient tolerated treatment well Patient left: with call bell/phone within reach;in bed;with SCD's reapplied Nurse Communication: Mobility status PT Visit Diagnosis: Muscle weakness (generalized) (M62.81);Difficulty in walking, not elsewhere classified (R26.2);Unsteadiness on feet (R26.81)     Time: 0938-1829 PT Time Calculation (min) (ACUTE ONLY): 33 min  Charges:  $Gait Training: 8-22 mins $Therapeutic Activity: 8-22 mins                     Verner Mould, DPT Acute Rehabilitation Services Office [phone]  08/15/22 4:28 PM

## 2022-08-15 NOTE — Progress Notes (Signed)
Physical Therapy Treatment Patient Details Name: Courtney [name] MRN: [id number] DOB: Aug 09, 1960 Today's Date: 08/15/2022   History of Present Illness Patient is 62 y.o. female s/p Lt TKA on 08/13/22 with PMH significant for OA, HTN, thyroid disease.    PT Comments    Patient limited today due to significant pain in Lt knee. Patient required increased time to complete supine>sit and assist needed to bring Rt LE off EOB. Pt able to stand with elevated EOB using bil UE and maintained balance with hand transition to RW. Distance limited with gait to ~5' due to pain and chair pulled up for pt to sit and rest. Reviewed ankle pumps for circulation and other exercises deferred due to pain. Will progress as able.    Recommendations for follow up therapy are one component of a multi-disciplinary discharge planning process, led by the attending physician.  Recommendations may be updated based on patient status, additional functional criteria and insurance authorization.  Follow Up Recommendations  Follow physician's recommendations for discharge plan and follow up therapies     Assistance Recommended at Discharge Frequent or constant Supervision/Assistance  Patient can return home with the following A little help with walking and/or transfers;A little help with bathing/dressing/bathroom;Assistance with cooking/housework;Assist for transportation;Help with stairs or ramp for entrance   Equipment Recommendations  Rolling walker (2 wheels)    Recommendations for Other Services       Precautions / Restrictions Precautions Precautions: Fall Restrictions Weight Bearing Restrictions: No LLE Weight Bearing: Weight bearing as tolerated Other Position/Activity Restrictions: WBAT     Mobility  Bed Mobility Overal bed mobility: Needs Assistance Bed Mobility: Supine to Sit       Sit to supine: Min assist, HOB elevated   General bed mobility comments: cues to use bed rail and belt for Lt LE.  assist needed to fully sit up to edge.    Transfers Overall transfer level: Needs assistance Equipment used: Rolling walker (2 wheels) Transfers: Sit to/from Stand Sit to Stand: Min guard           General transfer comment: EOB slightly elevated. guard for safety, pt able to rise with bil UE use.    Ambulation/Gait Ambulation/Gait assistance: Min guard Gait Distance (Feet): 5 Feet Assistive device: Rolling walker (2 wheels) Gait Pattern/deviations: Step-to pattern Gait velocity: decr     General Gait Details: pt able to maintain safe position to RW. gaurd for safety. distance limited by pain.   Stairs             Wheelchair Mobility    Modified Rankin (Stroke Patients Only)       Balance Overall balance assessment: Needs assistance Sitting-balance support: Feet supported Sitting balance-Leahy Scale: Good     Standing balance support: Reliant on assistive device for balance, During functional activity, Bilateral upper extremity supported Standing balance-Leahy Scale: Poor                              Cognition Arousal/Alertness: Awake/alert Behavior During Therapy: WFL for tasks assessed/performed Overall Cognitive Status: Within Functional Limits for tasks assessed                                          Exercises Total Joint Exercises Ankle Circles/Pumps: AROM, Both, 15 reps    General Comments        Pertinent  Vitals/Pain Pain Assessment Pain Assessment: Faces Faces Pain Scale: Hurts even more Pain Location: Lt Knee Pain Descriptors / Indicators: Aching, Discomfort Pain Intervention(s): Limited activity within patient's tolerance, Monitored during session, Repositioned    Home Living                          Prior Function            PT Goals (current goals can now be found in the care plan section) Acute Rehab PT Goals Patient Stated Goal: regain independence and knee to stop hurting PT  Goal Formulation: With patient Time For Goal Achievement: 08/20/22 Potential to Achieve Goals: Good Progress towards PT goals: Progressing toward goals    Frequency    7X/week      PT Plan Current plan remains appropriate    Co-evaluation              AM-PAC PT "6 Clicks" Mobility   Outcome Measure  Help needed turning from your back to your side while in a flat bed without using bedrails?: A Little Help needed moving from lying on your back to sitting on the side of a flat bed without using bedrails?: A Little Help needed moving to and from a bed to a chair (including a wheelchair)?: A Little Help needed standing up from a chair using your arms (e.g., wheelchair or bedside chair)?: A Little Help needed to walk in hospital room?: A Little Help needed climbing 3-5 steps with a railing? : A Little 6 Click Score: 18    End of Session Equipment Utilized During Treatment: Gait belt Activity Tolerance: Patient tolerated treatment well Patient left: in chair;with call bell/phone within reach;with chair alarm set Nurse Communication: Mobility status PT Visit Diagnosis: Muscle weakness (generalized) (M62.81);Difficulty in walking, not elsewhere classified (R26.2);Unsteadiness on feet (R26.81)     Time: 1610-9604 PT Time Calculation (min) (ACUTE ONLY): 16 min  Charges:  $Therapeutic Activity: 8-22 mins                     Verner Mould, DPT Acute Rehabilitation Services Office [phone]  08/15/22 11:59 AM

## 2022-08-16 DIAGNOSIS — Z96652 Presence of left artificial knee joint: Secondary | ICD-10-CM | POA: Diagnosis not present

## 2022-08-16 DIAGNOSIS — Z79899 Other long term (current) drug therapy: Secondary | ICD-10-CM | POA: Diagnosis not present

## 2022-08-16 DIAGNOSIS — I10 Essential (primary) hypertension: Secondary | ICD-10-CM | POA: Diagnosis not present

## 2022-08-16 DIAGNOSIS — M1712 Unilateral primary osteoarthritis, left knee: Secondary | ICD-10-CM | POA: Diagnosis not present

## 2022-08-16 DIAGNOSIS — Z7982 Long term (current) use of aspirin: Secondary | ICD-10-CM | POA: Diagnosis not present

## 2022-08-16 MED ORDER — ACETAMINOPHEN 500 MG PO TABS
1000.0000 mg | ORAL_TABLET | Freq: Four times a day (QID) | ORAL | Status: DC | PRN
  Administered 2022-08-16: 1000 mg via ORAL
  Filled 2022-08-16: qty 2

## 2022-08-16 NOTE — Plan of Care (Signed)
Plan of care reviewed and discussed. °

## 2022-08-16 NOTE — Progress Notes (Signed)
Physical Therapy Treatment Patient Details Name: Courtney [name] MRN: [id number] DOB: 06-Apr-1961 Today's Date: 08/16/2022   History of Present Illness Patient is 62 y.o. female s/p Lt TKA on 08/13/22 with PMH significant for OA, HTN, thyroid disease.    PT Comments    Patient making gradual progress with mobility. She was able to complete transfers and gait with min guard/supervision today using RW. Reviewed stair mobility and pt completed with +2 min guard/assist, educated on use of knee immobilizer to stabilize and increase pt safety/confidence with stair negotiation for return home. PT was instructed on safe donning of clothes in sitting to protect Lt knee and reduce risk of falling. Reviewed HEP and safe care transfer technique. She will benefit from follow up PT in OP setting. Will continue to progress as able during acute stay.   Recommendations for follow up therapy are one component of a multi-disciplinary discharge planning process, led by the attending physician.  Recommendations may be updated based on patient status, additional functional criteria and insurance authorization.  Follow Up Recommendations  Follow physician's recommendations for discharge plan and follow up therapies     Assistance Recommended at Discharge Frequent or constant Supervision/Assistance  Patient can return home with the following A little help with walking and/or transfers;A little help with bathing/dressing/bathroom;Assistance with cooking/housework;Assist for transportation;Help with stairs or ramp for entrance   Equipment Recommendations  Rolling walker (2 wheels)    Recommendations for Other Services       Precautions / Restrictions Precautions Precautions: Fall Restrictions Weight Bearing Restrictions: No LLE Weight Bearing: Weight bearing as tolerated Other Position/Activity Restrictions: WBAT     Mobility  Bed Mobility Overal bed mobility: Needs Assistance Bed Mobility: Sit to  Supine       Sit to supine: HOB elevated, Min guard   General bed mobility comments: guarding and cues for sequencing use of belt. pt able to move to EOB with extra time.    Transfers Overall transfer level: Needs assistance Equipment used: Rolling walker (2 wheels) Transfers: Sit to/from Stand Sit to Stand: Min guard, Supervision           General transfer comment: guard/supervision for safety. pt able to power up from EOB (slightly elevated). able to control lwoer/rise from toilet with use of grab bars.    Ambulation/Gait Ambulation/Gait assistance: Min guard, Supervision Gait Distance (Feet): 80 Feet Assistive device: Rolling walker (2 wheels) Gait Pattern/deviations: Step-to pattern Gait velocity: decr     General Gait Details: pt taking slow cautious gait pattern. no LOB or buckling throughout. pt steady and maintained safe position to RW.   Stairs Stairs: Yes Stairs assistance: Min guard, Min assist, +2 safety/equipment Stair Management: No rails, Step to pattern, Backwards, With walker Number of Stairs: 2 General stair comments: 2+ assist for safety, pt has 2 person assist available at home. cues for sequencing "up with good down with bad". no LOB noted. discussed use of knee immobilizer to increase knee stability and pt confidence in safety withstai   Wheelchair Mobility    Modified Rankin (Stroke Patients Only)       Balance Overall balance assessment: Needs assistance Sitting-balance support: Feet supported Sitting balance-Leahy Scale: Good Sitting balance - Comments: assist neede to dress sitting EOB   Standing balance support: Reliant on assistive device for balance, During functional activity, Bilateral upper extremity supported Standing balance-Leahy Scale: Fair Standing balance comment: pt able to dress lower body after toileting  Cognition Arousal/Alertness: Awake/alert Behavior During Therapy: WFL for  tasks assessed/performed Overall Cognitive Status: Within Functional Limits for tasks assessed                                          Exercises Total Joint Exercises Ankle Circles/Pumps: AROM, Both, 15 reps Quad Sets: AROM, Left, 10 reps Short Arc Quad: Left, 5 reps, AAROM Heel Slides: AROM, Left, 5 reps    General Comments        Pertinent Vitals/Pain Pain Assessment Pain Assessment: 0-10 Pain Score: 8  (rates high but reports much better than yesterday) Pain Location: Lt Knee Pain Descriptors / Indicators: Aching, Discomfort Pain Intervention(s): Limited activity within patient's tolerance, Monitored during session, Premedicated before session, Repositioned, RN gave pain meds during session    Home Living                          Prior Function            PT Goals (current goals can now be found in the care plan section) Acute Rehab PT Goals Patient Stated Goal: regain independence and knee to stop hurting PT Goal Formulation: With patient Time For Goal Achievement: 08/20/22 Potential to Achieve Goals: Good Progress towards PT goals: Progressing toward goals    Frequency    7X/week      PT Plan Current plan remains appropriate    Co-evaluation              AM-PAC PT "6 Clicks" Mobility   Outcome Measure  Help needed turning from your back to your side while in a flat bed without using bedrails?: A Little Help needed moving from lying on your back to sitting on the side of a flat bed without using bedrails?: A Little Help needed moving to and from a bed to a chair (including a wheelchair)?: A Little Help needed standing up from a chair using your arms (e.g., wheelchair or bedside chair)?: A Little Help needed to walk in hospital room?: A Little Help needed climbing 3-5 steps with a railing? : A Little 6 Click Score: 18    End of Session Equipment Utilized During Treatment: Gait belt Activity Tolerance: Patient  tolerated treatment well Patient left: with call bell/phone within reach;in bed;with SCD's reapplied Nurse Communication: Mobility status PT Visit Diagnosis: Muscle weakness (generalized) (M62.81);Difficulty in walking, not elsewhere classified (R26.2);Unsteadiness on feet (R26.81)     Time: [id number] PT Time Calculation (min) (ACUTE ONLY): 23 min  Charges:  $Gait Training: 8-22 mins $Therapeutic Exercise: 8-22 mins                     Verner Mould, DPT Acute Rehabilitation Services Office [phone]  08/16/22 12:19 PM

## 2022-08-16 NOTE — Progress Notes (Signed)
   Subjective: 3 Days Post-Op Procedure(s) (LRB): TOTAL KNEE ARTHROPLASTY (Left) Patient reports pain as moderate.   Patient seen in rounds for Dr. Wynelle Link. Patient is well, and has had no acute complaints or problems. Feeling much better this morning. No issues overnight. Denies chest pain, SOB, or calf pain.  We will continue therapy while she is in the hospital, ambulated 30' yesterday.   Objective: Vital signs in last 24 hours: Temp:  [99.3 F (37.4 C)-100.9 F (38.3 C)] 99.7 F (37.6 C) (01/11 0527) Pulse Rate:  [104-116] 108 (01/11 0527) Resp:  [16-18] 18 (01/11 0527) BP: (128-157)/(87-100) 146/92 (01/11 0527) SpO2:  [92 %-93 %] 92 % (01/11 0527)  Intake/Output from previous day:  Intake/Output Summary (Last 24 hours) at 08/16/2022 0823 Last data filed at 08/16/2022 0500 Gross per 24 hour  Intake 150 ml  Output --  Net 150 ml     Intake/Output this shift: No intake/output data recorded.  Labs: Recent Labs    08/14/22 0342 08/15/22 0334  HGB 12.0 11.7*   Recent Labs    08/14/22 0342 08/15/22 0334  WBC 10.6* 10.7*  RBC 3.93 3.93  HCT 36.6 35.9*  PLT 216 214   Recent Labs    08/14/22 0342  NA 135  K 3.4*  CL 106  CO2 22  BUN 17  CREATININE 0.74  GLUCOSE 145*  CALCIUM 8.0*   No results for input(s): "LABPT", "INR" in the last 72 hours.  Exam: General - Patient is Alert and Oriented Extremity - Neurovascular intact Dressing - dressing C/D/I Motor Function - intact, moving foot and toes well on exam. Normal plantar and dorsiflexion.  Past Medical History:  Diagnosis Date   Allergy    Arthritis    Family history of adverse reaction to anesthesia    Headache    Herpes    Hx of blood clots 08/06/1998   left leg x1    Hypertension    Thyroid disease     Assessment/Plan: 3 Days Post-Op Procedure(s) (LRB): TOTAL KNEE ARTHROPLASTY (Left) Principal Problem:   Unilateral primary osteoarthritis, left knee Active Problems:   Primary  osteoarthritis of left knee  Estimated body mass index is 29.95 kg/m as calculated from the following:   Height as of this encounter: 5\' 8"  (1.727 m).   Weight as of this encounter: 89.4 kg.   Anticipated LOS equal to or greater than 2 midnights due to - Age 4 and older with one or more of the following:  - Obesity  - Expected need for hospital services (PT, OT, Nursing) required for safe  discharge  - Anticipated need for postoperative skilled nursing care or inpatient rehab  - Active co-morbidities: None OR   - Unanticipated findings during/Post Surgery: Slow post-op progression: GI, pain control, mobility  - Patient is a high risk of re-admission due to: None   DVT Prophylaxis - Xarelto Weight bearing as tolerated. Continue therapy.  Plan is to go Home with son after hospital stay. Plan for discharge later today if progresses with therapy and meeting goals. Scheduled for OPPT at [facility]. Follow-up in the office in 2 weeks.  The PDMP database was reviewed today prior to any opioid medications being prescribed to this patient.   Shearon Balo, PA-C Orthopedic Surgery 08/16/2022, 8:23 AM

## 2022-08-17 DIAGNOSIS — M25662 Stiffness of left knee, not elsewhere classified: Secondary | ICD-10-CM | POA: Diagnosis not present

## 2022-08-17 DIAGNOSIS — M25562 Pain in left knee: Secondary | ICD-10-CM | POA: Diagnosis not present

## 2022-08-20 DIAGNOSIS — M25562 Pain in left knee: Secondary | ICD-10-CM | POA: Diagnosis not present

## 2022-08-20 NOTE — Discharge Summary (Signed)
Physician Discharge Summary   Patient ID: Courtney [name] MRN: [id number] DOB/AGE: 60-25-1962 60 y.o.  Admit date: 08/13/2022 Discharge date: 08/16/2022  Primary Diagnosis: Left knee osteoarthritis   Admission Diagnoses:  Past Medical History:  Diagnosis Date   Allergy    Arthritis    Family history of adverse reaction to anesthesia    Headache    Herpes    Hx of blood clots 08/06/1998   left leg x1    Hypertension    Thyroid disease    Discharge Diagnoses:   Principal Problem:   Unilateral primary osteoarthritis, left knee Active Problems:   Primary osteoarthritis of left knee  Estimated body mass index is 29.95 kg/m as calculated from the following:   Height as of this encounter: 5\' 8"  (1.727 m).   Weight as of this encounter: 89.4 kg.  Procedure:  Procedure(s) (LRB): TOTAL KNEE ARTHROPLASTY (Left)   Consults: None  HPI: Courtney [name] is a 62 y.o. year old female with end stage OA of her left knee with progressively worsening pain and dysfunction. She has constant pain, with activity and at rest and significant functional deficits with difficulties even with ADLs. She has had extensive non-op management including analgesics, injections of cortisone and viscosupplements, and home exercise program, but remains in significant pain with significant dysfunction. Radiographs show bone on bone arthritis lateral and patellofemoral. She presents now for left Total Knee Arthroplasty.      Laboratory Data: Admission on 08/13/2022, Discharged on 08/16/2022  Component Date Value Ref Range Status   WBC 08/14/2022 10.6 (H)  4.0 - 10.5 K/uL Final   RBC 08/14/2022 3.93  3.87 - 5.11 MIL/uL Final   Hemoglobin 08/14/2022 12.0  12.0 - 15.0 g/dL Final   HCT 08/14/2022 36.6  36.0 - 46.0 % Final   MCV 08/14/2022 93.1  80.0 - 100.0 fL Final   MCH 08/14/2022 30.5  26.0 - 34.0 pg Final   MCHC 08/14/2022 32.8  30.0 - 36.0 g/dL Final   RDW 08/14/2022 13.4  11.5 - 15.5 % Final    Platelets 08/14/2022 216  150 - 400 K/uL Final   nRBC 08/14/2022 0.0  0.0 - 0.2 % Final   Performed at [facility], [place] [address] [place], [place] [address]   Sodium 08/14/2022 135  135 - 145 mmol/L Final   Potassium 08/14/2022 3.4 (L)  3.5 - 5.1 mmol/L Final   Chloride 08/14/2022 106  98 - 111 mmol/L Final   CO2 08/14/2022 22  22 - 32 mmol/L Final   Glucose, Bld 08/14/2022 145 (H)  70 - 99 mg/dL Final   Glucose reference range applies only to samples taken after fasting for at least 8 hours.   BUN 08/14/2022 17  6 - 20 mg/dL Final   Creatinine, Ser 08/14/2022 0.74  0.44 - 1.00 mg/dL Final   Calcium 08/14/2022 8.0 (L)  8.9 - 10.3 mg/dL Final   GFR, Estimated 08/14/2022 >60  >60 mL/min Final   Comment: (NOTE) Calculated using the CKD-EPI Creatinine Equation (2021)    Anion gap 08/14/2022 7  5 - 15 Final   Performed at [facility], [place] [address] [place], [place] [address]   WBC 08/15/2022 10.7 (H)  4.0 - 10.5 K/uL Final   RBC 08/15/2022 3.93  3.87 - 5.11 MIL/uL Final   Hemoglobin 08/15/2022 11.7 (L)  12.0 - 15.0 g/dL Final   HCT 08/15/2022 35.9 (L)  36.0 - 46.0 % Final   MCV 08/15/2022  91.3  80.0 - 100.0 fL Final   MCH 08/15/2022 29.8  26.0 - 34.0 pg Final   MCHC 08/15/2022 32.6  30.0 - 36.0 g/dL Final   RDW 32/95/1884 13.6  11.5 - 15.5 % Final   Platelets 08/15/2022 214  150 - 400 K/uL Final   nRBC 08/15/2022 0.0  0.0 - 0.2 % Final   Performed at [facility], [address], [place], [place] [address]  Hospital Outpatient Visit on 08/01/2022  Component Date Value Ref Range Status   MRSA, PCR 08/01/2022 NEGATIVE  NEGATIVE Final   Staphylococcus aureus 08/01/2022 NEGATIVE  NEGATIVE Final   Comment: (NOTE) The Xpert SA Assay (FDA approved for NASAL specimens in patients 47 years of age and older), is one component of a comprehensive surveillance program. It is not intended to diagnose infection nor to guide or monitor  treatment. Performed at [facility], [address] [place], [place] [address]    Sodium 08/01/2022 139  135 - 145 mmol/L Final   Potassium 08/01/2022 3.7  3.5 - 5.1 mmol/L Final   Chloride 08/01/2022 110  98 - 111 mmol/L Final   CO2 08/01/2022 23  22 - 32 mmol/L Final   Glucose, Bld 08/01/2022 91  70 - 99 mg/dL Final   Glucose reference range applies only to samples taken after fasting for at least 8 hours.   BUN 08/01/2022 20  6 - 20 mg/dL Final   Creatinine, Ser 08/01/2022 0.69  0.44 - 1.00 mg/dL Final   Calcium 10/93/2355 9.0  8.9 - 10.3 mg/dL Final   GFR, Estimated 08/01/2022 >60  >60 mL/min Final   Comment: (NOTE) Calculated using the CKD-EPI Creatinine Equation (2021)    Anion gap 08/01/2022 6  5 - 15 Final   Performed at [facility], [address] [place], [place] [address]   WBC 08/01/2022 4.2  4.0 - 10.5 K/uL Final   RBC 08/01/2022 4.63  3.87 - 5.11 MIL/uL Final   Hemoglobin 08/01/2022 14.0  12.0 - 15.0 g/dL Final   HCT 25/42/7062 41.6  36.0 - 46.0 % Final   MCV 08/01/2022 89.8  80.0 - 100.0 fL Final   MCH 08/01/2022 30.2  26.0 - 34.0 pg Final   MCHC 08/01/2022 33.7  30.0 - 36.0 g/dL Final   RDW 37/62/8315 13.0  11.5 - 15.5 % Final   Platelets 08/01/2022 209  150 - 400 K/uL Final   nRBC 08/01/2022 0.0  0.0 - 0.2 % Final   Performed at [facility], [address], [place], [place] [address]     X-Rays:No results found.  EKG: Orders placed or performed during the hospital encounter of 08/01/22   EKG 12 lead per protocol   EKG 12 lead per protocol     Hospital Course: Courtney [name] is a 62 y.o. who was admitted to [facility]. They were brought to the operating room on 08/13/2022 and underwent Procedure(s): TOTAL KNEE ARTHROPLASTY.  Patient tolerated the procedure well and was later transferred to the recovery room and then to the orthopaedic floor for postoperative care. They were given PO and  IV analgesics for pain control following their surgery. They were given 24 hours of postoperative antibiotics of  Anti-infectives (From admission, onward)    Start     Dose/Rate Route Frequency Ordered Stop   08/14/22 1000  valACYclovir (VALTREX) tablet 500 mg  Status:  Discontinued        500 mg  Oral Daily 08/13/22 1145 08/16/22 1924   08/13/22 1500  ceFAZolin (ANCEF) IVPB 2g/100 mL premix        2 g 200 mL/hr over 30 Minutes Intravenous Every 6 hours 08/13/22 1145 08/13/22 2127   08/13/22 0730  ceFAZolin (ANCEF) IVPB 2g/100 mL premix        2 g 200 mL/hr over 30 Minutes Intravenous On call to O.R. 08/13/22 6962 08/13/22 9528      and started on DVT prophylaxis in the form of Xarelto.   PT and OT were ordered for total joint protocol. Discharge planning consulted to help with postop disposition and equipment needs. Patient had an uncomplicated hospital stay after surgery. They started to get up OOB with therapy on POD 0. Continued to work with therapy into POD #3. Pt was seen during rounds on day two and was ready to go home pending progress with therapy. Bulky dressing was removed and Aquacel dressing was noted to have scant drainage with adhesive borders intact. Pt worked with therapy for an additional session on POD 3 and was meeting their goals. She was discharged to home later that day in stable condition.  Diet: Regular diet Activity: WBAT Follow-up: in 2 weeks Disposition: Home Discharged Condition: good   Discharge Instructions     Call MD / Call 911   Complete by: As directed    If you experience chest pain or shortness of breath, CALL 911 and be transported to the hospital emergency room.  If you develope a fever above 101 F, pus (white drainage) or increased drainage or redness at the wound, or calf pain, call your surgeon's office.   Call MD / Call 911   Complete by: As directed    If you experience chest pain or shortness of breath, CALL 911 and be transported to the  hospital emergency room.  If you develope a fever above 101 F, pus (white drainage) or increased drainage or redness at the wound, or calf pain, call your surgeon's office.   Change dressing   Complete by: As directed    You may remove the bulky bandage (ACE wrap and gauze) two days after surgery. You will have an adhesive waterproof bandage underneath. Leave this in place until your first follow-up appointment.   Change dressing   Complete by: As directed    You may remove the bulky bandage (ACE wrap and gauze) two days after surgery. You will have an adhesive waterproof bandage underneath. Leave this in place until your first follow-up appointment.   Constipation Prevention   Complete by: As directed    Drink plenty of fluids.  Prune juice may be helpful.  You may use a stool softener, such as Colace (over the counter) 100 mg twice a day.  Use MiraLax (over the counter) for constipation as needed.   Constipation Prevention   Complete by: As directed    Drink plenty of fluids.  Prune juice may be helpful.  You may use a stool softener, such as Colace (over the counter) 100 mg twice a day.  Use MiraLax (over the counter) for constipation as needed.   Diet - low sodium heart healthy   Complete by: As directed    Diet general   Complete by: As directed    Do not put a pillow under the knee. Place it under the heel.   Complete by: As directed    Do not put a pillow under the knee. Place it under the heel.  Complete by: As directed    Driving restrictions   Complete by: As directed    No driving for two weeks   Driving restrictions   Complete by: As directed    No driving for two weeks   Post-operative opioid taper instructions:   Complete by: As directed    POST-OPERATIVE OPIOID TAPER INSTRUCTIONS: It is important to wean off of your opioid medication as soon as possible. If you do not need pain medication after your surgery it is ok to stop day one. Opioids include: Codeine,  Hydrocodone(Norco, Vicodin), Oxycodone(Percocet, oxycontin) and hydromorphone amongst others.  Long term and even short term use of opiods can cause: Increased pain response Dependence Constipation Depression Respiratory depression And more.  Withdrawal symptoms can include Flu like symptoms Nausea, vomiting And more Techniques to manage these symptoms Hydrate well Eat regular healthy meals Stay active Use relaxation techniques(deep breathing, meditating, yoga) Do Not substitute Alcohol to help with tapering If you have been on opioids for less than two weeks and do not have pain than it is ok to stop all together.  Plan to wean off of opioids This plan should start within one week post op of your joint replacement. Maintain the same interval or time between taking each dose and first decrease the dose.  Cut the total daily intake of opioids by one tablet each day Next start to increase the time between doses. The last dose that should be eliminated is the evening dose.      Post-operative opioid taper instructions:   Complete by: As directed    POST-OPERATIVE OPIOID TAPER INSTRUCTIONS: It is important to wean off of your opioid medication as soon as possible. If you do not need pain medication after your surgery it is ok to stop day one. Opioids include: Codeine, Hydrocodone(Norco, Vicodin), Oxycodone(Percocet, oxycontin) and hydromorphone amongst others.  Long term and even short term use of opiods can cause: Increased pain response Dependence Constipation Depression Respiratory depression And more.  Withdrawal symptoms can include Flu like symptoms Nausea, vomiting And more Techniques to manage these symptoms Hydrate well Eat regular healthy meals Stay active Use relaxation techniques(deep breathing, meditating, yoga) Do Not substitute Alcohol to help with tapering If you have been on opioids for less than two weeks and do not have pain than it is ok to stop all  together.  Plan to wean off of opioids This plan should start within one week post op of your joint replacement. Maintain the same interval or time between taking each dose and first decrease the dose.  Cut the total daily intake of opioids by one tablet each day Next start to increase the time between doses. The last dose that should be eliminated is the evening dose.      TED hose   Complete by: As directed    Use stockings (TED hose) for three weeks on both leg(s).  You may remove them at night for sleeping.   TED hose   Complete by: As directed    Use stockings (TED hose) for three weeks on both leg(s).  You may remove them at night for sleeping.   Weight bearing as tolerated   Complete by: As directed    Weight bearing as tolerated   Complete by: As directed       Allergies as of 08/16/2022       Reactions   Hydrocodone Other (See Comments)   Constipation   Oxycodone Other (See Comments)   Constipation  Medication List     STOP taking these medications    aspirin EC 81 MG tablet   ibuprofen 600 MG tablet Commonly known as: ADVIL   meloxicam 7.5 MG tablet Commonly known as: Mobic       TAKE these medications    acetaminophen 650 MG CR tablet Commonly known as: TYLENOL Take 1,300-1,950 mg by mouth See admin instructions. Take 1,950 mg by mouth in the morning and 1,300 mg at bedtime   amLODipine 2.5 MG tablet Commonly known as: NORVASC Take 2.5 mg by mouth daily.   gabapentin 300 MG capsule Commonly known as: NEURONTIN Take 1 capsule (300 mg total) by mouth as directed. Take a 300 mg capsule three times a day for two weeks following surgery.Then take a 300 mg capsule two times a day for two weeks. Then take a 300 mg capsule once a day for two weeks. Then discontinue.   levothyroxine 125 MCG tablet Commonly known as: SYNTHROID Take 125 mcg by mouth daily before breakfast. 6 days a week   Synthroid 125 MCG tablet Generic drug:  levothyroxine Take 125 mcg by mouth daily before breakfast.   loratadine 10 MG tablet Commonly known as: CLARITIN Take 10 mg by mouth in the morning.   losartan-hydrochlorothiazide 50-12.5 MG tablet Commonly known as: HYZAAR Take 1 tablet by mouth daily.   magnesium chloride 64 MG Tbec SR tablet Commonly known as: SLOW-MAG Take 2 tablets by mouth in the morning.   methocarbamol 500 MG tablet Commonly known as: ROBAXIN Take 1 tablet (500 mg total) by mouth every 6 (six) hours as needed for muscle spasms.   metoprolol succinate 25 MG 24 hr tablet Commonly known as: TOPROL-XL Take 50 mg by mouth at bedtime.   oxyCODONE 5 MG immediate release tablet Commonly known as: Oxy IR/ROXICODONE Take 1-2 tablets (5-10 mg total) by mouth every 6 (six) hours as needed for severe pain.   rivaroxaban 10 MG Tabs tablet Commonly known as: XARELTO Take 1 tablet (10 mg total) by mouth daily with breakfast for 19 days. Then take one 81 mg aspirin once a day for three weeks. Then discontinue aspirin.   Systane Ultra PF 0.4-0.3 % Soln Generic drug: Polyethyl Glyc-Propyl Glyc PF Place 1-2 drops into both eyes See admin instructions. Am and hs maybe   traMADol 50 MG tablet Commonly known as: ULTRAM Take 1-2 tablets (50-100 mg total) by mouth every 6 (six) hours as needed for moderate pain.   TURMERIC PO Take 1 capsule by mouth daily.   valACYclovir 500 MG tablet Commonly known as: VALTREX Take 500 mg by mouth daily.   vitamin C 1000 MG tablet Take 1,000 mg by mouth daily.   vitamin E 45 MG (100 UNITS) capsule Take by mouth daily.               Discharge Care Instructions  (From admission, onward)           Start     Ordered   08/16/22 0000  Weight bearing as tolerated        08/16/22 0829   08/16/22 0000  Change dressing       Comments: You may remove the bulky bandage (ACE wrap and gauze) two days after surgery. You will have an adhesive waterproof bandage underneath.  Leave this in place until your first follow-up appointment.   08/16/22 0829   08/14/22 0000  Weight bearing as tolerated        08/14/22 0727   08/14/22  0000  Change dressing       Comments: You may remove the bulky bandage (ACE wrap and gauze) two days after surgery. You will have an adhesive waterproof bandage underneath. Leave this in place until your first follow-up appointment.   08/14/22 3557            Follow-up Information     Gaynelle Arabian, MD Follow up in 2 week(s).   Specialty: Orthopedic Surgery Contact information: [address] [place] [address] [phone]                 Signed: Shearon Balo, PA-C Orthopedic Surgery 08/20/2022, 2:28 PM

## 2022-08-24 DIAGNOSIS — M25562 Pain in left knee: Secondary | ICD-10-CM | POA: Diagnosis not present

## 2022-08-27 DIAGNOSIS — M25662 Stiffness of left knee, not elsewhere classified: Secondary | ICD-10-CM | POA: Diagnosis not present

## 2022-08-27 DIAGNOSIS — M25562 Pain in left knee: Secondary | ICD-10-CM | POA: Diagnosis not present

## 2022-08-29 DIAGNOSIS — M25662 Stiffness of left knee, not elsewhere classified: Secondary | ICD-10-CM | POA: Diagnosis not present

## 2022-08-31 DIAGNOSIS — M25662 Stiffness of left knee, not elsewhere classified: Secondary | ICD-10-CM | POA: Diagnosis not present

## 2022-09-03 DIAGNOSIS — M25562 Pain in left knee: Secondary | ICD-10-CM | POA: Diagnosis not present

## 2022-09-03 DIAGNOSIS — M25662 Stiffness of left knee, not elsewhere classified: Secondary | ICD-10-CM | POA: Diagnosis not present

## 2022-09-05 DIAGNOSIS — M25562 Pain in left knee: Secondary | ICD-10-CM | POA: Diagnosis not present

## 2022-09-07 DIAGNOSIS — M25562 Pain in left knee: Secondary | ICD-10-CM | POA: Diagnosis not present

## 2022-09-10 DIAGNOSIS — M25562 Pain in left knee: Secondary | ICD-10-CM | POA: Diagnosis not present

## 2022-09-12 DIAGNOSIS — M25562 Pain in left knee: Secondary | ICD-10-CM | POA: Diagnosis not present

## 2022-09-12 DIAGNOSIS — M25662 Stiffness of left knee, not elsewhere classified: Secondary | ICD-10-CM | POA: Diagnosis not present

## 2022-09-17 DIAGNOSIS — M25662 Stiffness of left knee, not elsewhere classified: Secondary | ICD-10-CM | POA: Diagnosis not present

## 2022-09-17 DIAGNOSIS — M25562 Pain in left knee: Secondary | ICD-10-CM | POA: Diagnosis not present

## 2022-09-19 DIAGNOSIS — M25562 Pain in left knee: Secondary | ICD-10-CM | POA: Diagnosis not present

## 2022-09-19 DIAGNOSIS — M25662 Stiffness of left knee, not elsewhere classified: Secondary | ICD-10-CM | POA: Diagnosis not present

## 2022-09-19 DIAGNOSIS — Z5189 Encounter for other specified aftercare: Secondary | ICD-10-CM | POA: Diagnosis not present

## 2022-09-24 DIAGNOSIS — M25662 Stiffness of left knee, not elsewhere classified: Secondary | ICD-10-CM | POA: Diagnosis not present

## 2022-09-26 DIAGNOSIS — M25662 Stiffness of left knee, not elsewhere classified: Secondary | ICD-10-CM | POA: Diagnosis not present

## 2022-10-01 DIAGNOSIS — M25662 Stiffness of left knee, not elsewhere classified: Secondary | ICD-10-CM | POA: Diagnosis not present

## 2022-10-03 DIAGNOSIS — M25662 Stiffness of left knee, not elsewhere classified: Secondary | ICD-10-CM | POA: Diagnosis not present

## 2022-10-03 DIAGNOSIS — M25562 Pain in left knee: Secondary | ICD-10-CM | POA: Diagnosis not present

## 2022-10-08 DIAGNOSIS — M25662 Stiffness of left knee, not elsewhere classified: Secondary | ICD-10-CM | POA: Diagnosis not present

## 2022-10-10 DIAGNOSIS — M25662 Stiffness of left knee, not elsewhere classified: Secondary | ICD-10-CM | POA: Diagnosis not present

## 2022-10-11 ENCOUNTER — Other Ambulatory Visit: Payer: Self-pay | Admitting: Internal Medicine

## 2022-10-11 DIAGNOSIS — Z1231 Encounter for screening mammogram for malignant neoplasm of breast: Secondary | ICD-10-CM

## 2022-10-12 ENCOUNTER — Ambulatory Visit
Admission: RE | Admit: 2022-10-12 | Discharge: 2022-10-12 | Disposition: A | Discharge status: Home / Self Care | Payer: Federal, State, Local not specified - PPO | Source: Ambulatory Visit | Attending: Internal Medicine | Admitting: Internal Medicine

## 2022-10-12 DIAGNOSIS — Z1231 Encounter for screening mammogram for malignant neoplasm of breast: Secondary | ICD-10-CM | POA: Diagnosis not present

## 2022-10-15 DIAGNOSIS — M25662 Stiffness of left knee, not elsewhere classified: Secondary | ICD-10-CM | POA: Diagnosis not present

## 2022-10-15 DIAGNOSIS — M25562 Pain in left knee: Secondary | ICD-10-CM | POA: Diagnosis not present

## 2022-10-17 DIAGNOSIS — M25662 Stiffness of left knee, not elsewhere classified: Secondary | ICD-10-CM | POA: Diagnosis not present

## 2022-10-17 DIAGNOSIS — M25562 Pain in left knee: Secondary | ICD-10-CM | POA: Diagnosis not present

## 2022-10-22 DIAGNOSIS — M25562 Pain in left knee: Secondary | ICD-10-CM | POA: Diagnosis not present

## 2022-11-06 ENCOUNTER — Encounter (INDEPENDENT_AMBULATORY_CARE_PROVIDER_SITE_OTHER): Payer: Federal, State, Local not specified - PPO | Admitting: Ophthalmology

## 2022-12-27 DIAGNOSIS — Z96652 Presence of left artificial knee joint: Secondary | ICD-10-CM | POA: Diagnosis not present

## 2023-02-06 DIAGNOSIS — Z96652 Presence of left artificial knee joint: Secondary | ICD-10-CM | POA: Diagnosis not present

## 2023-02-15 DIAGNOSIS — H2511 Age-related nuclear cataract, right eye: Secondary | ICD-10-CM | POA: Diagnosis not present

## 2023-02-15 DIAGNOSIS — H524 Presbyopia: Secondary | ICD-10-CM | POA: Diagnosis not present

## 2023-02-15 DIAGNOSIS — H04123 Dry eye syndrome of bilateral lacrimal glands: Secondary | ICD-10-CM | POA: Diagnosis not present

## 2023-04-10 DIAGNOSIS — Z5189 Encounter for other specified aftercare: Secondary | ICD-10-CM | POA: Diagnosis not present

## 2023-05-09 DIAGNOSIS — E039 Hypothyroidism, unspecified: Secondary | ICD-10-CM | POA: Diagnosis not present

## 2023-05-09 DIAGNOSIS — I10 Essential (primary) hypertension: Secondary | ICD-10-CM | POA: Diagnosis not present

## 2023-05-09 DIAGNOSIS — Z0189 Encounter for other specified special examinations: Secondary | ICD-10-CM | POA: Diagnosis not present

## 2023-05-09 DIAGNOSIS — Z1389 Encounter for screening for other disorder: Secondary | ICD-10-CM | POA: Diagnosis not present

## 2023-05-16 DIAGNOSIS — Z1339 Encounter for screening examination for other mental health and behavioral disorders: Secondary | ICD-10-CM | POA: Diagnosis not present

## 2023-05-16 DIAGNOSIS — Z1331 Encounter for screening for depression: Secondary | ICD-10-CM | POA: Diagnosis not present

## 2023-05-16 DIAGNOSIS — I10 Essential (primary) hypertension: Secondary | ICD-10-CM | POA: Diagnosis not present

## 2023-05-16 DIAGNOSIS — Z0000 Encounter for general adult medical examination without abnormal findings: Secondary | ICD-10-CM | POA: Diagnosis not present

## 2023-09-11 ENCOUNTER — Other Ambulatory Visit: Payer: Self-pay | Admitting: Internal Medicine

## 2023-09-11 DIAGNOSIS — Z1231 Encounter for screening mammogram for malignant neoplasm of breast: Secondary | ICD-10-CM

## 2023-10-01 DIAGNOSIS — R002 Palpitations: Secondary | ICD-10-CM | POA: Diagnosis not present

## 2023-10-01 DIAGNOSIS — I10 Essential (primary) hypertension: Secondary | ICD-10-CM | POA: Diagnosis not present

## 2023-10-01 DIAGNOSIS — E039 Hypothyroidism, unspecified: Secondary | ICD-10-CM | POA: Diagnosis not present

## 2023-10-03 DIAGNOSIS — Z96652 Presence of left artificial knee joint: Secondary | ICD-10-CM | POA: Diagnosis not present

## 2023-10-03 DIAGNOSIS — M1711 Unilateral primary osteoarthritis, right knee: Secondary | ICD-10-CM | POA: Diagnosis not present

## 2023-10-03 DIAGNOSIS — M25561 Pain in right knee: Secondary | ICD-10-CM | POA: Diagnosis not present

## 2023-10-16 ENCOUNTER — Ambulatory Visit
Admission: RE | Admit: 2023-10-16 | Discharge: 2023-10-16 | Disposition: A | Discharge status: Home / Self Care | Payer: Federal, State, Local not specified - PPO | Source: Ambulatory Visit | Attending: Internal Medicine | Admitting: Internal Medicine

## 2023-10-16 DIAGNOSIS — Z1231 Encounter for screening mammogram for malignant neoplasm of breast: Secondary | ICD-10-CM

## 2023-10-30 DIAGNOSIS — L658 Other specified nonscarring hair loss: Secondary | ICD-10-CM | POA: Diagnosis not present

## 2023-11-14 DIAGNOSIS — I10 Essential (primary) hypertension: Secondary | ICD-10-CM | POA: Diagnosis not present

## 2024-02-17 DIAGNOSIS — H2511 Age-related nuclear cataract, right eye: Secondary | ICD-10-CM | POA: Diagnosis not present

## 2024-02-17 DIAGNOSIS — H524 Presbyopia: Secondary | ICD-10-CM | POA: Diagnosis not present

## 2024-05-28 DIAGNOSIS — I10 Essential (primary) hypertension: Secondary | ICD-10-CM | POA: Diagnosis not present

## 2024-06-04 DIAGNOSIS — I10 Essential (primary) hypertension: Secondary | ICD-10-CM | POA: Diagnosis not present
